# Patient Record
Sex: Female | Born: 1965 | Race: White | Hispanic: Yes | Smoking: Never smoker
Health system: Southern US, Community
[De-identification: ages and names within clinical notes are randomized; demographics above are authoritative.]

## PROBLEM LIST (undated history)

## (undated) DIAGNOSIS — M810 Age-related osteoporosis without current pathological fracture: Secondary | ICD-10-CM

## (undated) DIAGNOSIS — R7989 Other specified abnormal findings of blood chemistry: Secondary | ICD-10-CM

## (undated) DIAGNOSIS — F320 Major depressive disorder, single episode, mild: Secondary | ICD-10-CM

## (undated) DIAGNOSIS — E079 Disorder of thyroid, unspecified: Secondary | ICD-10-CM

## (undated) DIAGNOSIS — G4733 Obstructive sleep apnea (adult) (pediatric): Secondary | ICD-10-CM

## (undated) DIAGNOSIS — C801 Malignant (primary) neoplasm, unspecified: Secondary | ICD-10-CM

## (undated) DIAGNOSIS — R002 Palpitations: Secondary | ICD-10-CM

## (undated) DIAGNOSIS — Z9109 Other allergy status, other than to drugs and biological substances: Secondary | ICD-10-CM

## (undated) DIAGNOSIS — H9203 Otalgia, bilateral: Secondary | ICD-10-CM

## (undated) DIAGNOSIS — E785 Hyperlipidemia, unspecified: Secondary | ICD-10-CM

## (undated) DIAGNOSIS — C50919 Malignant neoplasm of unspecified site of unspecified female breast: Secondary | ICD-10-CM

## (undated) DIAGNOSIS — E669 Obesity, unspecified: Secondary | ICD-10-CM

## (undated) HISTORY — PX: APPENDECTOMY: SHX54

## (undated) HISTORY — DX: Malignant neoplasm of unspecified site of unspecified female breast: C50.919

## (undated) HISTORY — DX: Disorder of thyroid, unspecified: E07.9

## (undated) HISTORY — PX: MASTECTOMY: SHX3

## (undated) HISTORY — DX: Palpitations: R00.2

## (undated) HISTORY — DX: Obstructive sleep apnea (adult) (pediatric): G47.33

## (undated) HISTORY — PX: ABDOMINAL HYSTERECTOMY: SHX81

## (undated) HISTORY — DX: Age-related osteoporosis without current pathological fracture: M81.0

## (undated) HISTORY — DX: Obesity, unspecified: E66.9

## (undated) HISTORY — PX: ABDOMINOPLASTY: SUR9

## (undated) HISTORY — DX: Major depressive disorder, single episode, mild: F32.0

## (undated) HISTORY — DX: Other specified abnormal findings of blood chemistry: R79.89

## (undated) HISTORY — DX: Otalgia, bilateral: H92.03

## (undated) HISTORY — PX: BREAST RECONSTRUCTION: SHX9

## (undated) HISTORY — DX: Other allergy status, other than to drugs and biological substances: Z91.09

## (undated) HISTORY — DX: Hyperlipidemia, unspecified: E78.5

---

## 2011-05-21 DIAGNOSIS — N9489 Other specified conditions associated with female genital organs and menstrual cycle: Secondary | ICD-10-CM | POA: Insufficient documentation

## 2013-04-07 DIAGNOSIS — N952 Postmenopausal atrophic vaginitis: Secondary | ICD-10-CM | POA: Insufficient documentation

## 2013-11-20 LAB — HM COLONOSCOPY

## 2014-03-02 DIAGNOSIS — M222X2 Patellofemoral disorders, left knee: Secondary | ICD-10-CM

## 2014-03-02 DIAGNOSIS — M222X1 Patellofemoral disorders, right knee: Secondary | ICD-10-CM | POA: Insufficient documentation

## 2014-03-02 DIAGNOSIS — M7122 Synovial cyst of popliteal space [Baker], left knee: Secondary | ICD-10-CM | POA: Insufficient documentation

## 2014-04-27 DIAGNOSIS — G4733 Obstructive sleep apnea (adult) (pediatric): Secondary | ICD-10-CM | POA: Insufficient documentation

## 2014-11-11 DIAGNOSIS — N651 Disproportion of reconstructed breast: Secondary | ICD-10-CM | POA: Insufficient documentation

## 2015-04-05 DIAGNOSIS — Z853 Personal history of malignant neoplasm of breast: Secondary | ICD-10-CM | POA: Insufficient documentation

## 2016-03-07 DIAGNOSIS — Z923 Personal history of irradiation: Secondary | ICD-10-CM | POA: Insufficient documentation

## 2016-03-07 DIAGNOSIS — Z9221 Personal history of antineoplastic chemotherapy: Secondary | ICD-10-CM | POA: Insufficient documentation

## 2016-08-03 ENCOUNTER — Encounter (INDEPENDENT_AMBULATORY_CARE_PROVIDER_SITE_OTHER): Payer: Self-pay

## 2016-08-03 ENCOUNTER — Ambulatory Visit (INDEPENDENT_AMBULATORY_CARE_PROVIDER_SITE_OTHER): Payer: BC Managed Care – PPO | Admitting: Sports Medicine

## 2016-08-03 ENCOUNTER — Ambulatory Visit (INDEPENDENT_AMBULATORY_CARE_PROVIDER_SITE_OTHER): Payer: BC Managed Care – PPO

## 2016-08-03 DIAGNOSIS — M5412 Radiculopathy, cervical region: Secondary | ICD-10-CM

## 2016-08-03 DIAGNOSIS — M7918 Myalgia, other site: Secondary | ICD-10-CM | POA: Insufficient documentation

## 2016-08-03 MED ORDER — CYCLOBENZAPRINE HCL 10 MG PO TABS: 10.0000 mg | ORAL_TABLET | Freq: Every day | ORAL | 11 refills | Status: DC

## 2016-08-03 MED ORDER — PREDNISONE 50 MG PO TABS: ORAL_TABLET | ORAL | 0 refills | Status: DC

## 2016-08-03 NOTE — Assessment & Plan Note (Signed)
Right periscapular radiculopathy, C5, C6. She does have history of metastatic breast cancer so we are going to obtain an x-ray, early MRI, 5 days of prednisone, I'm going to refill her Flexeril and she is going to aggressive formal physical therapy. Return to see me in one month.

## 2016-08-03 NOTE — Progress Notes (Signed)
   Subjective:    I'm seeing this patient as a consultation for:  Dr. Katrinka Blazing.  CC: Right shoulder pain  HPI: This is a pleasant 51 year old female, she comes in with a several year history of pain between her shoulder blades, right-sided, wrapping around the shoulder blades to the latissimus. Moderate, persistent without radiation down the arm, no hand or fingertip paresthesias. She does have a history of stage III breast cancer, post breast reconstruction, she did have sentinel node biopsy and suspected surgical cure. No trauma, no constitutional symptoms, she would like to establish medical care with my partner Sherlie Ban PA-C.  Past medical history:  Negative.  See flowsheet/record as well for more information.  Surgical history: Negative.  See flowsheet/record as well for more information.  Family history: Negative.  See flowsheet/record as well for more information.  Social history: Negative.  See flowsheet/record as well for more information.  Allergies, and medications have been entered into the medical record, reviewed, and no changes needed.   Review of Systems: No headache, visual changes, nausea, vomiting, diarrhea, constipation, dizziness, abdominal pain, skin rash, fevers, chills, night sweats, weight loss, swollen lymph nodes, body aches, joint swelling, muscle aches, chest pain, shortness of breath, mood changes, visual or auditory hallucinations.   Objective:   General: Well Developed, well nourished, and in no acute distress.  Neuro/Psych: Alert and oriented x3, extra-ocular muscles intact, able to move all 4 extremities, sensation grossly intact. Skin: Warm and dry, no rashes noted.  Respiratory: Not using accessory muscles, speaking in full sentences, trachea midline.  Cardiovascular: Pulses palpable, no extremity edema. Abdomen: Does not appear distended. Neck: Negative spurling's Full neck range of motion Grip strength and sensation normal in bilateral  hands Strength good C4 to T1 distribution No sensory change to C4 to T1 Reflexes normal  Impression and Recommendations:   This case required medical decision making of moderate complexity.  Right cervical radiculopathy Right periscapular radiculopathy, C5, C6. She does have history of metastatic breast cancer so we are going to obtain an x-ray, early MRI, 5 days of prednisone, I'm going to refill her Flexeril and she is going to aggressive formal physical therapy. Return to see me in one month.

## 2016-08-10 ENCOUNTER — Telehealth: Payer: Self-pay | Admitting: Sports Medicine

## 2016-08-10 MED ORDER — DIAZEPAM 5 MG PO TABS: ORAL_TABLET | ORAL | 0 refills | Status: DC

## 2016-08-10 NOTE — Telephone Encounter (Signed)
Rx faxed. Left information on Pt's VM.

## 2016-08-10 NOTE — Telephone Encounter (Signed)
Prescription for Valium is in my box. 

## 2016-08-10 NOTE — Telephone Encounter (Signed)
Pt would like to be premedicated prior to upcoming MRI on Monday. Routing for review.

## 2016-08-13 ENCOUNTER — Encounter: Payer: Self-pay | Admitting: Emergency Medicine

## 2016-08-13 ENCOUNTER — Emergency Department (INDEPENDENT_AMBULATORY_CARE_PROVIDER_SITE_OTHER)
Admission: EM | Admit: 2016-08-13 | Discharge: 2016-08-13 | Disposition: A | Discharge status: Home / Self Care | Payer: BC Managed Care – PPO | Source: Home / Self Care | Attending: Family Medicine | Admitting: Family Medicine

## 2016-08-13 ENCOUNTER — Ambulatory Visit (INDEPENDENT_AMBULATORY_CARE_PROVIDER_SITE_OTHER): Payer: BC Managed Care – PPO

## 2016-08-13 ENCOUNTER — Emergency Department (INDEPENDENT_AMBULATORY_CARE_PROVIDER_SITE_OTHER): Payer: BC Managed Care – PPO

## 2016-08-13 ENCOUNTER — Other Ambulatory Visit: Payer: BC Managed Care – PPO

## 2016-08-13 DIAGNOSIS — M79621 Pain in right upper arm: Secondary | ICD-10-CM | POA: Diagnosis not present

## 2016-08-13 DIAGNOSIS — R0789 Other chest pain: Secondary | ICD-10-CM | POA: Diagnosis not present

## 2016-08-13 DIAGNOSIS — M5412 Radiculopathy, cervical region: Secondary | ICD-10-CM

## 2016-08-13 DIAGNOSIS — R0781 Pleurodynia: Secondary | ICD-10-CM

## 2016-08-13 DIAGNOSIS — R079 Chest pain, unspecified: Secondary | ICD-10-CM

## 2016-08-13 HISTORY — DX: Malignant (primary) neoplasm, unspecified: C80.1

## 2016-08-13 MED ORDER — GABAPENTIN 100 MG PO CAPS: 100.0000 mg | ORAL_CAPSULE | Freq: Three times a day (TID) | ORAL | 0 refills | Status: DC

## 2016-08-13 NOTE — ED Provider Notes (Signed)
CSN: [id number]     Arrival date & time 08/13/16  1709 History   First MD Initiated Contact with Patient 08/13/16 1728     Chief Complaint  Patient presents with  . Breast Pain    right axilla and border of breast   (Consider location/radiation/quality/duration/timing/severity/associated sxs/prior Treatment) HPI Sherry Romero is a 51 y.o. female presenting to UC with c/o Right axillary and Right sided chest wall pain since last visit with Dr. Dianah Field on 08/03/16.  Pt was seen at that time for cervical radiculopathy. Pt notes she was also treated recently for shingles on 07/24/16.  Pain is aching and burning at times but no rash.  Pt is a breast cancer survivor and has breast implants about 2 years ago after a total mastectomy.  Pt is concerned about lymph nodes in her chest and axilla causing the pain.  She does have routine f/u with her oncologist in March.  Denies fever, chills, cough, congestion, SOB.    Past Medical History:  Diagnosis Date  . Cancer [facility])    Past Surgical History:  Procedure Laterality Date  . implant breast Left   . MASTECTOMY Bilateral    No family history on file. Social History  Substance Use Topics  . Smoking status: Never Smoker  . Smokeless tobacco: Never Used  . Alcohol use No   OB History    No data available     Review of Systems  Constitutional: Negative for chills, diaphoresis, fatigue, fever and unexpected weight change.  Cardiovascular: Positive for chest pain (Right side). Negative for palpitations and leg swelling.  Gastrointestinal: Negative for diarrhea, nausea and vomiting.  Musculoskeletal: Negative for arthralgias, gait problem and joint swelling.  Skin: Negative for color change, rash and wound.    Allergies  Patient has no known allergies.  Home Medications   Prior to Admission medications   Medication Sig Start Date End Date Taking? Authorizing Provider  levothyroxine (SYNTHROID, LEVOTHROID) 100 MCG tablet Take  100 mcg by mouth daily before breakfast. Dose unknown   Yes Historical Provider, MD  cyclobenzaprine (FLEXERIL) 10 MG tablet Take 1 tablet (10 mg total) by mouth at bedtime. 08/03/16   Silverio Decamp, MD  diazepam (VALIUM) 5 MG tablet Take 1 tab PO 1 hour before procedure or imaging. 08/10/16   Silverio Decamp, MD  gabapentin (NEURONTIN) 100 MG capsule Take 1 capsule (100 mg total) by mouth 3 (three) times daily. 08/13/16   Noland Fordyce, PA-C  predniSONE (DELTASONE) 50 MG tablet One tab PO daily for 5 days. 08/03/16   Silverio Decamp, MD   Meds Ordered and Administered this Visit  Medications - No data to display  BP 103/69 (BP Location: Right Arm)   Pulse 85   Temp 98.5 F (36.9 C) (Oral)   Resp 16   Ht 4\' 11"  (1.499 m)   Wt 185 lb (83.9 kg)   SpO2 96%   BMI 37.37 kg/m  No data found.   Physical Exam  Constitutional: She is oriented to person, place, and time. She appears well-developed and well-nourished. No distress.  HENT:  Head: Normocephalic and atraumatic.  Eyes: EOM are normal.  Neck: Normal range of motion.  Cardiovascular: Normal rate and regular rhythm.   Pulmonary/Chest: Effort normal and breath sounds normal. No respiratory distress. She has no wheezes. She has no rales. She exhibits tenderness.    Mild tenderness to Right axilla and Right side anterior chest. No erythema or rashes. No crepitus.  Musculoskeletal: Normal  range of motion.  Neurological: She is alert and oriented to person, place, and time.  Skin: Skin is warm and dry. She is not diaphoretic.  Psychiatric: She has a normal mood and affect. Her behavior is normal.  Nursing note and vitals reviewed.   Urgent Care Course     Procedures (including critical care time)  Labs Review Labs Reviewed - No data to display  Imaging Review Dg Chest 2 View  Result Date: 08/13/2016 CLINICAL DATA:  51 y/o  F; EXAM: CHEST  2 VIEW COMPARISON:  None. FINDINGS: The heart size and mediastinal  contours are within normal limits. Both lungs are clear. No acute osseous abnormality. Left axillary surgical clips. Mild S-shaped curvature of the spine and degenerative changes. IMPRESSION: No active cardiopulmonary disease. Electronically Signed   By: Kristine Garbe M.D.   On: 08/13/2016 18:05     MDM   1. Axillary pain, right   2. Right-sided chest pain    Right axillary pain and Right side chest pain. Pt reports recent shingles in similar area. Rash no longer present.  Pain could be due to postherpetic neuralgia.  Rx: gabapentin  Encouraged f/u with PCP and oncologist for further evaluation and recheck of symptoms if not improving in 1 week, sooner if worsening.    Noland Fordyce, PA-C 08/13/16 1926

## 2016-08-13 NOTE — ED Triage Notes (Signed)
Error: pain in right axilla.

## 2016-08-13 NOTE — ED Triage Notes (Signed)
Patient says she has had pain in left axilla and along border of left breast where she has implant post mastectomy in past; states unrelieved with ibuprofen and  Has been going on since last visit 08/03/16.

## 2016-08-14 ENCOUNTER — Telehealth: Payer: Self-pay | Admitting: Sports Medicine

## 2016-08-14 NOTE — Telephone Encounter (Signed)
Diazepam is not for pain and is more dangerous and addictive than gabapentin.  She needs to take the gabapentin.

## 2016-08-14 NOTE — Telephone Encounter (Signed)
Patient called request to know if she can have Diazepam called in for her pain. She said she was called in gabapentin but she is afraid to take that and she took the diazepam before she had the mri and it took away her pain and it helped her a lot. Please adv- Thanks

## 2016-08-15 NOTE — Telephone Encounter (Signed)
Okay I will call patient back to advise her of this. Thank you

## 2016-08-22 ENCOUNTER — Encounter: Payer: Self-pay | Admitting: Physician Assistant

## 2016-08-22 ENCOUNTER — Ambulatory Visit (INDEPENDENT_AMBULATORY_CARE_PROVIDER_SITE_OTHER): Payer: BC Managed Care – PPO | Admitting: Physician Assistant

## 2016-08-22 VITALS — BP 125/65 | HR 85 | Wt 183.0 lb

## 2016-08-22 DIAGNOSIS — E039 Hypothyroidism, unspecified: Secondary | ICD-10-CM | POA: Insufficient documentation

## 2016-08-22 DIAGNOSIS — R7989 Other specified abnormal findings of blood chemistry: Secondary | ICD-10-CM | POA: Diagnosis not present

## 2016-08-22 DIAGNOSIS — M810 Age-related osteoporosis without current pathological fracture: Secondary | ICD-10-CM | POA: Insufficient documentation

## 2016-08-22 HISTORY — DX: Other specified abnormal findings of blood chemistry: R79.89

## 2016-08-22 LAB — POCT UA - MICROALBUMIN
CREATININE, POC: 50 mg/dL
MICROALBUMIN (UR) POC: 10 mg/L

## 2016-08-22 NOTE — Progress Notes (Signed)
HPI:                                                                Sherry Romero is a 51 y.o. female who presents to [place]: [facility] today for elevated creatinine  Patient with history of LCIS (2008) was seen by her Oncologist for follow-up on 08/20/16 and found to have an elevated serum creatinine of 1.11. Baseline creatinine 0.70 - 0.80 (12/22/15, 10/02/15)  Patient reports on Friday she took Dulcolax for constipation. Patient states on Saturday and Sunday she had abdominal cramping, vomiting and diarrhea. These symptoms have since resolved. She denies fevers, chills, weight loss. Denies dysuria, frequency, urgency, and hematuria.   Past Medical History:  Diagnosis Date  . Cancer ([place])   . Hyperlipidemia   . Obesity   . OSA (obstructive sleep apnea)   . Osteoporosis   . Thyroid disease    Past Surgical History:  Procedure Laterality Date  . implant breast Left   . MASTECTOMY Bilateral    Social History  Substance Use Topics  . Smoking status: Never Smoker  . Smokeless tobacco: Never Used  . Alcohol use No   family history is not on file.  ROS: negative except as noted in the HPI  Medications: Current Outpatient Prescriptions  Medication Sig Dispense Refill  . atorvastatin (LIPITOR) 20 MG tablet Take 20 mg by mouth daily.    . Biotin 800 MCG TABS Take 800 mcg by mouth.    . co-enzyme Q-10 30 MG capsule Take 100 mg by mouth daily.    . cyclobenzaprine (FLEXERIL) 10 MG tablet Take 1 tablet (10 mg total) by mouth at bedtime. 30 tablet 11  . denosumab (PROLIA) 60 MG/ML SOLN injection Inject 60 mg into the skin every 6 (six) months. Administer in upper arm, thigh, or abdomen    . levothyroxine (SYNTHROID, LEVOTHROID) 100 MCG tablet Take 50 mcg by mouth daily before breakfast. Dose unknown     . magnesium oxide (MAG-OX) 400 MG tablet Take 400 mg by mouth daily.    . Naltrexone-Bupropion HCl ER 8-90 MG TB12 Take 8-90 mg by mouth  daily.     No current facility-administered medications for this visit.    Allergies  Allergen Reactions  . Pollen Extract Shortness Of Breath    [Other] asthma  . Bee Pollen Other (See Comments)    Allergic to bees  -Shock] anaphylaxis  . Latex Rash    [Rash] red rash (gloves, and tape products)  . Letrozole Other (See Comments)    Muscle spasms allergic to generic only  . Levothyroxine Nausea Only    Only occurs with generic, not name brand  . Pork Allergy Nausea Only    GI upset  . Tape Rash    [Rash] [Other] red/swollen       Objective:  BP 125/65   Pulse 85   Wt 183 lb (83 kg)   BMI 36.96 kg/m  Gen: well-groomed, obese, cooperative, not ill-appearing, no distress Pulm: Normal work of breathing, normal phonation Cv: no peripheral edema Neuro: alert and oriented x 3, EOM's intact MSK: normal gait and station Skin: warm and dry, no rashes or lesions on exposed skin, no cyanosis   Results for orders placed or  performed in visit on 08/22/16 (from the past 72 hour(s))  POCT UA - Microalbumin     Status: Normal   Collection Time: 08/22/16 10:37 AM  Result Value Ref Range   Microalbumin Ur, POC 10 mg/L   Creatinine, POC 50 mg/dL   Albumin/Creatinine Ratio, Urine, POC <30    No results found.  I personally reviewed outside labs from 08/20/2016 including CMP which showed creatinine of 1.11, electrolytes and transaminases wnl, CBC wnl, elevated triglycerides (160), and TSH wnl  Assessment and Plan: 51 y.o. female with   1. Elevated serum creatinine - like pre-renal AKI secondary to dehydration from vomiting and diarrhea - POCT UA - Microalbumin wnl today - reviewed patient's medication list, including OTC supplements - plan to avoid nephrotoxic drugs, including NSAIDs and laxatives, increase hydration status and recheck Scr and GFR in 1 week - BASIC METABOLIC PANEL WITH GFR  Patient education and anticipatory guidance given Patient agrees with treatment  plan Follow-up in 1 week for labs or sooner as needed  Darlyne Russian PA-C

## 2016-08-22 NOTE — Patient Instructions (Addendum)
Go get your blood drawn on Monday, March 5th. The lab is a walk-in open M-F 8a-5p (closed 12:30-1:30p). You do not need to be fasting  No over-the-counter pain relievers (no Aspirin, Fenoprofen, ibuprofen, motrin, advil, aleve, naproxen, etc.)  No laxatives  Stay well-hydrated - drink at least 1L of fluid per day

## 2016-08-29 LAB — BASIC METABOLIC PANEL WITH GFR
BUN: 19 mg/dL (ref 7–25)
CALCIUM: 9.9 mg/dL (ref 8.6–10.4)
CHLORIDE: 101 mmol/L (ref 98–110)
CO2: 25 mmol/L (ref 20–31)
Creat: 0.74 mg/dL (ref 0.50–1.05)
GFR, Est African American: >89 mL/min (ref >=60)
GLUCOSE: 91 mg/dL (ref 65–99)
Potassium: 4.2 mmol/L (ref 3.5–5.3)
Sodium: 137 mmol/L (ref 135–146)

## 2016-08-29 NOTE — Progress Notes (Signed)
Patient's BMP today shows resolution of AKI and normal Scr and BUN  Lab Results  Component Value Date   CREATININE 0.74 08/28/2016   BUN 19 08/28/2016   NA 137 08/28/2016   K 4.2 08/28/2016   CL 101 08/28/2016   CO2 25 08/28/2016

## 2016-08-31 ENCOUNTER — Other Ambulatory Visit: Payer: Self-pay | Admitting: Physician Assistant

## 2016-08-31 ENCOUNTER — Ambulatory Visit: Payer: BC Managed Care – PPO | Admitting: Sports Medicine

## 2016-09-03 ENCOUNTER — Ambulatory Visit: Payer: BC Managed Care – PPO | Admitting: Sports Medicine

## 2016-09-20 ENCOUNTER — Ambulatory Visit: Payer: BC Managed Care – PPO | Admitting: Sports Medicine

## 2016-09-24 ENCOUNTER — Encounter: Payer: Self-pay | Admitting: Physician Assistant

## 2016-09-24 ENCOUNTER — Ambulatory Visit (INDEPENDENT_AMBULATORY_CARE_PROVIDER_SITE_OTHER): Payer: BC Managed Care – PPO | Admitting: Physician Assistant

## 2016-09-24 ENCOUNTER — Encounter: Payer: Self-pay | Admitting: Sports Medicine

## 2016-09-24 ENCOUNTER — Telehealth: Payer: Self-pay

## 2016-09-24 ENCOUNTER — Ambulatory Visit (INDEPENDENT_AMBULATORY_CARE_PROVIDER_SITE_OTHER): Payer: BC Managed Care – PPO | Admitting: Sports Medicine

## 2016-09-24 VITALS — BP 114/74 | HR 71 | Wt 180.0 lb

## 2016-09-24 DIAGNOSIS — E781 Pure hyperglyceridemia: Secondary | ICD-10-CM | POA: Diagnosis not present

## 2016-09-24 DIAGNOSIS — E010 Iodine-deficiency related diffuse (endemic) goiter: Secondary | ICD-10-CM | POA: Diagnosis not present

## 2016-09-24 DIAGNOSIS — Z6836 Body mass index (BMI) 36.0-36.9, adult: Secondary | ICD-10-CM | POA: Diagnosis not present

## 2016-09-24 DIAGNOSIS — M47812 Spondylosis without myelopathy or radiculopathy, cervical region: Secondary | ICD-10-CM | POA: Diagnosis not present

## 2016-09-24 DIAGNOSIS — M8580 Other specified disorders of bone density and structure, unspecified site: Secondary | ICD-10-CM

## 2016-09-24 DIAGNOSIS — E6609 Other obesity due to excess calories: Secondary | ICD-10-CM | POA: Diagnosis not present

## 2016-09-24 DIAGNOSIS — R768 Other specified abnormal immunological findings in serum: Secondary | ICD-10-CM

## 2016-09-24 DIAGNOSIS — IMO0001 Reserved for inherently not codable concepts without codable children: Secondary | ICD-10-CM

## 2016-09-24 DIAGNOSIS — R760 Raised antibody titer: Secondary | ICD-10-CM

## 2016-09-24 LAB — LIPID PANEL W/REFLEX DIRECT LDL
CHOL/HDL RATIO: 3.1 ratio (ref <5.0)
CHOLESTEROL: 135 mg/dL (ref <200)
HDL: 44 mg/dL — AB (ref >50)
LDL-Cholesterol: 74 mg/dL
Non-HDL Cholesterol (Calc): 91 mg/dL (ref <130)
Triglycerides: 89 mg/dL (ref <150)

## 2016-09-24 MED ORDER — NALTREXONE-BUPROPION HCL ER 8-90 MG PO TB12: ORAL_TABLET | ORAL | 0 refills | Status: DC

## 2016-09-24 MED ORDER — MENTHOL (TOPICAL ANALGESIC) 4 % EX GEL: CUTANEOUS | Status: DC

## 2016-09-24 NOTE — Telephone Encounter (Signed)
-----   Message from [facility], [place] sent at 09/24/2016  1:11 PM EDT ----- Let Sherry Romero know I am going to order an ultrasound of her thyroid. Her labs have always been monitored and look good, but we have never gotten any imaging of the thyroid, so I think it would be a good idea .

## 2016-09-24 NOTE — Assessment & Plan Note (Signed)
80-90% improvement with conservative measures, continue with Flexeril, I have advised that she use Biofreeze and gets massages for the remaining muscular pain. Cervical spine MRI showed a small protruding disc but otherwise negative.

## 2016-09-24 NOTE — Progress Notes (Signed)
  Subjective:    CC: Follow-up  HPI: Neck pain: Resolved with rehabilitation exercises, prednisone, and occasional Flexeril. Only has slight pain now.  Past medical history:  Negative.  See flowsheet/record as well for more information.  Surgical history: Negative.  See flowsheet/record as well for more information.  Family history: Negative.  See flowsheet/record as well for more information.  Social history: Negative.  See flowsheet/record as well for more information.  Allergies, and medications have been entered into the medical record, reviewed, and no changes needed.   Review of Systems: No fevers, chills, night sweats, weight loss, chest pain, or shortness of breath.   Objective:    General: Well Developed, well nourished, and in no acute distress.  Neuro: Alert and oriented x3, extra-ocular muscles intact, sensation grossly intact.  HEENT: Normocephalic, atraumatic, pupils equal round reactive to light, neck supple, no masses, no lymphadenopathy, thyroid nonpalpable.  Skin: Warm and dry, no rashes. Cardiac: Regular rate and rhythm, no murmurs rubs or gallops, no lower extremity edema.  Respiratory: Clear to auscultation bilaterally. Not using accessory muscles, speaking in full sentences. Neck: Negative spurling's Full neck range of motion Grip strength and sensation normal in bilateral hands Strength good C4 to T1 distribution No sensory change to C4 to T1 Reflexes normal  Impression and Recommendations:    Cervical spondylosis 80-90% improvement with conservative measures, continue with Flexeril, I have advised that she use Biofreeze and gets massages for the remaining muscular pain. Cervical spine MRI showed a small protruding disc but otherwise negative.

## 2016-09-24 NOTE — Patient Instructions (Addendum)
Protein powder Vega-One - vanilla flavor (white and blue package)  For your weight: - follow-up with the nutritionist - you will be contacted to schedule an appointment - restart Contrave starter pack - follow-up in 1 month for weight check    Food Choices to Lower Your Triglycerides Triglycerides are a type of fat in your blood. High levels of triglycerides can increase the risk of heart disease and stroke. If your triglyceride levels are high, the foods you eat and your eating habits are very important. Choosing the right foods can help lower your triglycerides. What general guidelines do I need to follow?  Lose weight if you are overweight.  Limit or avoid alcohol.  Fill one half of your plate with vegetables and green salads.  Limit fruit to two servings a day. Choose fruit instead of juice.  Make one fourth of your plate whole grains. Look for the word "whole" as the first word in the ingredient list.  Fill one fourth of your plate with lean protein foods.  Enjoy fatty fish (such as salmon, mackerel, sardines, and tuna) three times a week.  Choose healthy fats.  Limit foods high in starch and sugar.  Eat more home-cooked food and less restaurant, buffet, and fast food.  Limit fried foods.  Cook foods using methods other than frying.  Limit saturated fats.  Check ingredient lists to avoid foods with partially hydrogenated oils (trans fats) in them. What foods can I eat? Grains  Whole grains, such as whole wheat or whole grain breads, crackers, cereals, and pasta. Unsweetened oatmeal, bulgur, barley, quinoa, or brown rice. Corn or whole wheat flour tortillas. Vegetables  Fresh or frozen vegetables (raw, steamed, roasted, or grilled). Green salads. Fruits  All fresh, canned (in natural juice), or frozen fruits. Meat and Other Protein Products  Ground beef (85% or leaner), grass-fed beef, or beef trimmed of fat. Skinless chicken or [place]. Ground chicken or [place].  Pork trimmed of fat. All fish and seafood. Eggs. Dried beans, peas, or lentils. Unsalted nuts or seeds. Unsalted canned or dry beans. Dairy  Low-fat dairy products, such as skim or 1% milk, 2% or reduced-fat cheeses, low-fat ricotta or cottage cheese, or plain low-fat yogurt. Fats and Oils  Tub margarines without trans fats. Light or reduced-fat mayonnaise and salad dressings. Avocado. Safflower, olive, or canola oils. Natural peanut or almond butter. The items listed above may not be a complete list of recommended foods or beverages. Contact your dietitian for more options.  What foods are not recommended? Grains  White bread. White pasta. White rice. Cornbread. Bagels, pastries, and croissants. Crackers that contain trans fat. Vegetables  White potatoes. Corn. Creamed or fried vegetables. Vegetables in a cheese sauce. Fruits  Dried fruits. Canned fruit in light or heavy syrup. Fruit juice. Meat and Other Protein Products  Fatty cuts of meat. Ribs, chicken wings, bacon, sausage, bologna, salami, chitterlings, fatback, hot dogs, bratwurst, and packaged luncheon meats. Dairy  Whole or 2% milk, cream, half-and-half, and cream cheese. Whole-fat or sweetened yogurt. Full-fat cheeses. Nondairy creamers and whipped toppings. Processed cheese, cheese spreads, or cheese curds. Sweets and Desserts  Corn syrup, sugars, honey, and molasses. Candy. Jam and jelly. Syrup. Sweetened cereals. Cookies, pies, cakes, donuts, muffins, and ice cream. Fats and Oils  Butter, stick margarine, lard, shortening, ghee, or bacon fat. Coconut, palm kernel, or palm oils. Beverages  Alcohol. Sweetened drinks (such as sodas, lemonade, and fruit drinks or punches). The items listed above may not be a complete  list of foods and beverages to avoid. Contact your dietitian for more information.  This information is not intended to replace advice given to you by your health care provider. Make sure you discuss any questions you  have with your health care provider. Document Released: 03/29/2004 Document Revised: 11/17/2015 Document Reviewed: 04/15/2013 Elsevier Interactive Patient Education  2017 [employer].

## 2016-09-24 NOTE — Progress Notes (Signed)
HPI:                                                                Sherry Romero is a 51 y.o. female who presents to [place]: [place] today to establish care   Current concerns include weight  Acquired Hypothyroidism: taking Synthroid 50 mcg daily. Last TSH 1.7 05/2017.  HLD: taking Lipitor 20mg  daily. Denies myalgias. Lipid panel 06/19/2016 TC 167 TG 160 HDL 47 LDL 88  Osteoporosis: doing Prolia injections with [facility] orthopedics  Obesity: patient has been prescribed Contrave by another provider. She states she took it for a couple of days, but she was scared about side effects and self-discontinued. She has also been on Qsymia in the past. She is currently tracking her meals with an app. She does not exercise.  Hx of breast cancer: s/p bilateral mastectomy. Completed 10 years of hormone blocking agents. She is followed by Dr. Oswald Hillock, Oncology at [facility].   Patient is also wondering if she needs to be vaccinated for chickenpox because she never had the virus as a child.  Health Maintenance Health Maintenance  Topic Date Due  . Hepatitis C Screening  1965/07/15  . HIV Screening  08/04/1978  . INFLUENZA VACCINE  01/23/2017  . COLONOSCOPY  11/21/2023  . TETANUS/TDAP  10/03/2025    GYN/Sexual Health  Menstrual status: hysterectomy for DUB  History of abnormal pap smears: no  Sexually active: not currently 2/2 to pain  Current contraception: none  Health Habits  Diet: fair  Exercise: none  Past Medical History:  Diagnosis Date  . Breast cancer ([place])   . Cancer ([place])   . Hyperlipidemia   . Obesity   . OSA (obstructive sleep apnea)   . Osteoporosis   . Thyroid disease    Past Surgical History:  Procedure Laterality Date  . ABDOMINAL HYSTERECTOMY     TAH - no cancer  . ABDOMINOPLASTY    . APPENDECTOMY    . BREAST RECONSTRUCTION    . CESAREAN SECTION     x 3  . MASTECTOMY Bilateral    Social History   Substance Use Topics  . Smoking status: Never Smoker  . Smokeless tobacco: Never Used  . Alcohol use 1.2 oz/week    2 Glasses of wine per week   family history includes Heart disease in her maternal grandfather and paternal grandfather; Hyperlipidemia in her father and mother; Hypertension in her father, maternal grandfather, maternal grandmother, mother, paternal grandfather, and paternal grandmother; Stroke in her mother.  ROS: negative except as noted in the HPI  Medications: Current Outpatient Prescriptions  Medication Sig Dispense Refill  . atorvastatin (LIPITOR) 20 MG tablet TAKE 1 TABLET(20 MG) BY MOUTH DAILY 90 tablet 0  . Biotin 800 MCG TABS Take 800 mcg by mouth.    . co-enzyme Q-10 30 MG capsule Take 100 mg by mouth daily.    . cyclobenzaprine (FLEXERIL) 10 MG tablet Take 1 tablet (10 mg total) by mouth at bedtime. 30 tablet 11  . denosumab (PROLIA) 60 MG/ML SOLN injection Inject 60 mg into the skin every 6 (six) months. Administer in upper arm, thigh, or abdomen    . levothyroxine (SYNTHROID, LEVOTHROID) 50 MCG tablet TAKE 1 TABLET BY MOUTH EVERY DAY  30 tablet 0  . magnesium oxide (MAG-OX) 400 MG tablet Take 400 mg by mouth daily.    . Naltrexone-Bupropion HCl ER 8-90 MG TB12 1 tab daily for week 1, then 1 tab BID for week 2, then 2 tab PO qAM and 1 tab PO qPM for week 3, then 2 tabs BID. 80 tablet 0   No current facility-administered medications for this visit.    Allergies  Allergen Reactions  . Pollen Extract Shortness Of Breath    [Other] asthma  . Bee Pollen Other (See Comments)    Allergic to bees  -Shock] anaphylaxis  . Latex Rash    [Rash] red rash (gloves, and tape products)  . Letrozole Other (See Comments)    Muscle spasms allergic to generic only  . Levothyroxine Nausea Only    Only occurs with generic, not name brand  . Pork Allergy Nausea Only    GI upset  . Tape Rash    [Rash] [Other] red/swollen       Objective:  BP 114/74   Pulse 71   Wt  180 lb (81.6 kg)   BMI 36.36 kg/m  Gen: well-groomed, cooperative, obese, not ill-appearing, no distress HEENT: normal conjunctiva, wearing glasses, TM's clear, oropharynx clear, moist mucus membranes, there is thyromegaly, right greater than left, thyroid exhibits no tenderness Pulm: Normal work of breathing, normal phonation, clear to auscultation bilaterally CV: Normal rate, regular rhythm, s1 and s2 distinct, no murmurs, clicks or rubs, no carotid bruit GI: abdomen soft, nondistended, nontender, no masses Neuro: alert and oriented x 3, EOM's intact, PERRLA, DTR's intact, normal tone, no tremor MSK: moving all extremities, normal gait and station, no peripheral edema Skin: warm and dry, no rashes or lesions on exposed skin Psych: normal affect, euthymic mood, normal speech and thought content  Depression screen [facility] 2/9 09/24/2016  Decreased Interest 0  Down, Depressed, Hopeless 0  PHQ - 2 Score 0    Assessment and Plan: 51 y.o. female with   1. Hypertriglyceridemia - Lipid Panel w/reflex Direct LDL - low triglyceride diet  2. Class 2 obesity due to excess calories with serious comorbidity and body mass index (BMI) of 36.0 to 36.9 in adult - Amb ref to Medical Nutrition Therapy-MNT - discussed restarting Contrave. Patient was mainly concerned about seizures. She has no history of seizure disorders and was reassured she is low risk of this. - Naltrexone-Bupropion HCl ER 8-90 MG TB12; 1 tab daily for week 1, then 1 tab BID for week 2, then 2 tab PO qAM and 1 tab PO qPM for week 3, then 2 tabs BID.  Dispense: 80 tablet; Refill: 0 - follow-up weight check in 4 weeks  3. Osteopenia, unspecified location - VITAMIN D 25 Hydroxy (Vit-D Deficiency, Fractures) - cont to follow-up with Ortho for Prolia injections  4. Abnormal antibody titer - Varicella zoster antibody, IgG  5. Thyromegaly - US THYROID; Future   Patient education and anticipatory guidance given Patient agrees with  treatment plan Follow-up 4 weeks for weight check or sooner as needed  Darlyne Russian PA-C

## 2016-09-24 NOTE — Telephone Encounter (Signed)
Pt notified during visit with Dr. Darene Lamer -EH/RMA

## 2016-09-25 LAB — VITAMIN D 25 HYDROXY (VIT D DEFICIENCY, FRACTURES): VIT D 25 HYDROXY: 39 ng/mL (ref 30–100)

## 2016-09-25 LAB — VARICELLA ZOSTER ANTIBODY, IGG: Varicella IgG: >4000 Index — ABNORMAL HIGH (ref <135.00)

## 2016-09-27 ENCOUNTER — Other Ambulatory Visit: Payer: BC Managed Care – PPO

## 2016-09-30 ENCOUNTER — Other Ambulatory Visit: Payer: Self-pay | Admitting: Physician Assistant

## 2016-10-01 ENCOUNTER — Encounter: Payer: Self-pay | Admitting: Physician Assistant

## 2016-10-02 ENCOUNTER — Other Ambulatory Visit: Payer: BC Managed Care – PPO

## 2016-10-04 ENCOUNTER — Ambulatory Visit (INDEPENDENT_AMBULATORY_CARE_PROVIDER_SITE_OTHER): Payer: BC Managed Care – PPO

## 2016-10-04 DIAGNOSIS — E049 Nontoxic goiter, unspecified: Secondary | ICD-10-CM | POA: Diagnosis not present

## 2016-10-04 DIAGNOSIS — E010 Iodine-deficiency related diffuse (endemic) goiter: Secondary | ICD-10-CM

## 2016-10-06 ENCOUNTER — Encounter: Payer: Self-pay | Admitting: Physician Assistant

## 2016-10-08 ENCOUNTER — Encounter: Payer: Self-pay | Admitting: Physician Assistant

## 2016-10-24 ENCOUNTER — Ambulatory Visit (INDEPENDENT_AMBULATORY_CARE_PROVIDER_SITE_OTHER): Payer: BC Managed Care – PPO | Admitting: Physician Assistant

## 2016-10-24 VITALS — BP 114/76 | HR 93 | Wt 178.0 lb

## 2016-10-24 DIAGNOSIS — IMO0001 Reserved for inherently not codable concepts without codable children: Secondary | ICD-10-CM

## 2016-10-24 DIAGNOSIS — E6609 Other obesity due to excess calories: Secondary | ICD-10-CM | POA: Diagnosis not present

## 2016-10-24 DIAGNOSIS — S99912A Unspecified injury of left ankle, initial encounter: Secondary | ICD-10-CM

## 2016-10-24 DIAGNOSIS — Z6836 Body mass index (BMI) 36.0-36.9, adult: Secondary | ICD-10-CM

## 2016-10-24 MED ORDER — NALTREXONE-BUPROPION HCL ER 8-90 MG PO TB12: 2.0000 | ORAL_TABLET | Freq: Two times a day (BID) | ORAL | 0 refills | Status: DC

## 2016-10-24 NOTE — Progress Notes (Signed)
HPI:                                                                Sherry Romero is a 51 y.o. female who presents to [place]: [place] today for weight check  Patient has been taking Contrave for the last month without difficulty. She also saw the medical nutritionist and has been working on her diet. She has lost 4 pounds and reports she is feeling "great."  Patient also reports she inverted her left ankle while walking approx. 4 days ago. She states she initially had moderate pain, but this has since resolved. She would like to have it looked at today.  Past Medical History:  Diagnosis Date  . Breast cancer ([place])   . Cancer ([place])   . Hyperlipidemia   . Obesity   . OSA (obstructive sleep apnea)   . Osteoporosis   . Thyroid disease    Past Surgical History:  Procedure Laterality Date  . ABDOMINAL HYSTERECTOMY     TAH - no cancer  . ABDOMINOPLASTY    . APPENDECTOMY    . BREAST RECONSTRUCTION    . CESAREAN SECTION     x 3  . MASTECTOMY Bilateral    Social History  Substance Use Topics  . Smoking status: Never Smoker  . Smokeless tobacco: Never Used  . Alcohol use 1.2 oz/week    2 Glasses of wine per week   family history includes Heart disease in her maternal grandfather and paternal grandfather; Hyperlipidemia in her father and mother; Hypertension in her father, maternal grandfather, maternal grandmother, mother, paternal grandfather, and paternal grandmother; Stroke in her mother.  ROS: negative except as noted in the HPI  Medications: Current Outpatient Prescriptions  Medication Sig Dispense Refill  . atorvastatin (LIPITOR) 20 MG tablet TAKE 1 TABLET(20 MG) BY MOUTH DAILY 90 tablet 0  . Biotin 800 MCG TABS Take 800 mcg by mouth.    . co-enzyme Q-10 30 MG capsule Take 100 mg by mouth daily.    . cyclobenzaprine (FLEXERIL) 10 MG tablet Take 1 tablet (10 mg total) by mouth at bedtime. 30 tablet 11  . denosumab  (PROLIA) 60 MG/ML SOLN injection Inject 60 mg into the skin every 6 (six) months. Administer in upper arm, thigh, or abdomen    . magnesium oxide (MAG-OX) 400 MG tablet Take 400 mg by mouth daily.    . Menthol, Topical Analgesic, (BIOFREEZE ROLL-ON COLORLESS) 4 % GEL Roll on topically to the affected area twice a day to 3 times a day    . Naltrexone-Bupropion HCl ER 8-90 MG TB12 1 tab daily for week 1, then 1 tab BID for week 2, then 2 tab PO qAM and 1 tab PO qPM for week 3, then 2 tabs BID. 80 tablet 0  . SYNTHROID 50 MCG tablet TAKE 1 TABLET BY MOUTH EVERY DAY 90 tablet 0   No current facility-administered medications for this visit.    Allergies  Allergen Reactions  . Pollen Extract Shortness Of Breath    [Other] asthma  . Bee Pollen Other (See Comments)    Allergic to bees  -Shock] anaphylaxis  . Latex Rash    [Rash] red rash (gloves, and tape products)  . Letrozole Other (  See Comments)    Muscle spasms allergic to generic only  . Levothyroxine Nausea Only    Only occurs with generic, not name brand  . Pork Allergy Nausea Only    GI upset  . Tape Rash    [Rash] [Other] red/swollen       Objective:  BP 114/76   Pulse 93   Wt 178 lb (80.7 kg)   BMI 35.95 kg/m  Gen: well-groomed, obese, cooperative, not ill-appearing, no distress Pulm: Normal work of breathing, normal phonation Neuro: alert and oriented x 3, normal tone, no tremor MSK: Left Ankle/Foot: atraumatic, no edema, no ecchymosis, no bony tenderness, full active ROM, well-healed longitudinal surgical scar on dorsum of left foot, strength intact; normal gait and station, no peripheral edema Psych: good eye contact, normal affect, euthymic mood, normal speech and thought content    No results found for this or any previous visit (from the past 72 hour(s)). No results found.    Assessment and Plan: 51 y.o. female with   1. Class 2 obesity due to excess calories with serious comorbidity and body mass index  (BMI) of 36.0 to 36.9 in adult - Naltrexone-Bupropion HCl ER 8-90 MG TB12; Take 2 tablets by mouth 2 (two) times daily.  Dispense: 80 tablet; Refill: 0  2. Left ankle injury - reassuring physical exam - I do not feel X-rays are warranted. Patient is pain-free with weight-bearing - reassurance provided  Patient education and anticipatory guidance given Patient agrees with treatment plan Follow-up as needed if symptoms worsen or fail to improve  Darlyne Russian PA-C

## 2016-11-21 ENCOUNTER — Ambulatory Visit: Payer: BC Managed Care – PPO

## 2016-11-23 ENCOUNTER — Encounter: Payer: Self-pay | Admitting: Physician Assistant

## 2016-11-25 ENCOUNTER — Other Ambulatory Visit: Payer: Self-pay | Admitting: Physician Assistant

## 2016-11-28 ENCOUNTER — Ambulatory Visit (INDEPENDENT_AMBULATORY_CARE_PROVIDER_SITE_OTHER): Payer: BC Managed Care – PPO | Admitting: Physician Assistant

## 2016-11-28 VITALS — BP 116/81 | HR 87 | Temp 98.2°F | Wt 181.0 lb

## 2016-11-28 DIAGNOSIS — L237 Allergic contact dermatitis due to plants, except food: Secondary | ICD-10-CM | POA: Insufficient documentation

## 2016-11-28 MED ORDER — TRIAMCINOLONE ACETONIDE 0.5 % EX OINT: 1.0000 "application " | TOPICAL_OINTMENT | Freq: Two times a day (BID) | CUTANEOUS | 0 refills | Status: DC

## 2016-11-28 MED ORDER — HYDROXYZINE HCL 50 MG PO TABS: 50.0000 mg | ORAL_TABLET | Freq: Every evening | ORAL | 0 refills | Status: DC | PRN

## 2016-11-28 NOTE — Progress Notes (Signed)
HPI:                                                                Sherry Romero is a 51 y.o. female who presents to [facility] today for rash  Onset: yesterday Location: started on right forearm Duration: constant Character: pruritic, burning Aggravating factors / Triggers: none Evolution: red papules Treatments tried: Benadryl, Calamine  Recent illness / systemic symptoms: none  Medication / drug exposure: none Recent travel: none Animal/insect exposure: recently gardening/landscaping History of allergies: yes Exposure to new soaps, perfumes, cleaning products: none Exposure to chemicals: none  Past Medical History:  Diagnosis Date  . Breast cancer ([place])   . Cancer ([place])   . Hyperlipidemia   . Obesity   . OSA (obstructive sleep apnea)   . Osteoporosis   . Thyroid disease    Past Surgical History:  Procedure Laterality Date  . ABDOMINAL HYSTERECTOMY     TAH - no cancer  . ABDOMINOPLASTY    . APPENDECTOMY    . BREAST RECONSTRUCTION    . CESAREAN SECTION     x 3  . MASTECTOMY Bilateral    Social History  Substance Use Topics  . Smoking status: Never Smoker  . Smokeless tobacco: Never Used  . Alcohol use 1.2 oz/week    2 Glasses of wine per week   family history includes Heart disease in her maternal grandfather and paternal grandfather; Hyperlipidemia in her father and mother; Hypertension in her father, maternal grandfather, maternal grandmother, mother, paternal grandfather, and paternal grandmother; Stroke in her mother.  ROS: negative except as noted in the HPI  Medications: Current Outpatient Prescriptions  Medication Sig Dispense Refill  . atorvastatin (LIPITOR) 20 MG tablet TAKE 1 TABLET BY MOUTH DAILY 90 tablet 0  . Biotin 800 MCG TABS Take 800 mcg by mouth.    . co-enzyme Q-10 30 MG capsule Take 100 mg by mouth daily.    . cyclobenzaprine (FLEXERIL) 10 MG tablet Take 1 tablet (10 mg total)  by mouth at bedtime. 30 tablet 11  . denosumab (PROLIA) 60 MG/ML SOLN injection Inject 60 mg into the skin every 6 (six) months. Administer in upper arm, thigh, or abdomen    . magnesium oxide (MAG-OX) 400 MG tablet Take 400 mg by mouth daily.    . Menthol, Topical Analgesic, (BIOFREEZE ROLL-ON COLORLESS) 4 % GEL Roll on topically to the affected area twice a day to 3 times a day    . Naltrexone-Bupropion HCl ER 8-90 MG TB12 Take 2 tablets by mouth 2 (two) times daily. 80 tablet 0  . SYNTHROID 50 MCG tablet TAKE 1 TABLET BY MOUTH EVERY DAY 90 tablet 0   No current facility-administered medications for this visit.    Allergies  Allergen Reactions  . Pollen Extract Shortness Of Breath    [Other] asthma  . Bee Pollen Other (See Comments)    Allergic to bees  -Shock] anaphylaxis  . Latex Rash    [Rash] red rash (gloves, and tape products)  . Letrozole Other (See Comments)    Muscle spasms allergic to generic only  . Levothyroxine Nausea Only    Only occurs with generic, not name brand  . Pork Allergy Nausea Only  GI upset  . Tape Rash    [Rash] [Other] red/swollen       Objective:  BP 116/81   Pulse 87   Temp 98.2 F (36.8 C) (Oral)   Wt 181 lb (82.1 kg)   BMI 36.56 kg/m  Gen: well-groomed, cooperative, not ill-appearing, no distress HEENT: normocephalic, atraumatic, no facial swelling, oropharynx clear, uvula midline  Pulm: Normal work of breathing, normal phonation, clear to auscultation bilaterally, no wheezes, rales or rhonchi CV: Normal rate, regular rhythm, s1 and s2 distinct, no murmurs, clicks or rubs  Neuro: alert and oriented x 3, EOM's intact, no tremor MSK: moving all extremities, normal gait and station, no peripheral edema Skin: warm, dry, intact; multiple erythematous papules on ventral aspect of right wrist/forearm    No results found for this or any previous visit (from the past 72 hour(s)). No results found.    Assessment and Plan: 51 y.o. female  with   1. Allergic contact dermatitis due to plants, except food - no angioedema or airway involvement. Rash is localized to right extremity - symptomatic management with topical corticosteroid and antihistamine at bedtime - triamcinolone ointment (KENALOG) 0.5 %; Apply 1 application topically 2 (two) times daily. To affected area, avoid eyes and face  Dispense: 30 g; Refill: 0 - hydrOXYzine (ATARAX/VISTARIL) 50 MG tablet; Take 1 tablet (50 mg total) by mouth at bedtime and may repeat dose one time if needed. For itching.  Dispense: 30 tablet; Refill: 0  Patient education and anticipatory guidance given Patient agrees with treatment plan Follow-up as needed if symptoms worsen or fail to improve  Darlyne Russian PA-C

## 2016-11-28 NOTE — Patient Instructions (Addendum)
- Apply steroid ointment to affected areas twice a day. AVOID CONTACT WITH FACE/EYES - Hydroxyzine 1 tab at bedtime as needed for itching - Wash all clothing and bedding to remove any oils/allergens - Remove all jewelry from wrists    Contact Dermatitis Dermatitis is redness, soreness, and swelling (inflammation) of the skin. Contact dermatitis is a reaction to certain substances that touch the skin. There are two types of contact dermatitis:  Irritant contact dermatitis. This type is caused by something that irritates your skin, such as dry hands from washing them too much. This type does not require previous exposure to the substance for a reaction to occur. This type is more common.  Allergic contact dermatitis. This type is caused by a substance that you are allergic to, such as a nickel allergy or poison ivy. This type only occurs if you have been exposed to the substance (allergen) before. Upon a repeat exposure, your body reacts to the substance. This type is less common.  What are the causes? Many different substances can cause contact dermatitis. Irritant contact dermatitis is most commonly caused by exposure to:  Makeup.  Soaps.  Detergents.  Bleaches.  Acids.  Metal salts, such as nickel.  Allergic contact dermatitis is most commonly caused by exposure to:  Poisonous plants.  Chemicals.  Jewelry.  Latex.  Medicines.  Preservatives in products, such as clothing.  What increases the risk? This condition is more likely to develop in:  People who have jobs that expose them to irritants or allergens.  People who have certain medical conditions, such as asthma or eczema.  What are the signs or symptoms? Symptoms of this condition may occur anywhere on your body where the irritant has touched you or is touched by you. Symptoms include:  Dryness or flaking.  Redness.  Cracks.  Itching.  Pain or a burning feeling.  Blisters.  Drainage of small  amounts of blood or clear fluid from skin cracks.  With allergic contact dermatitis, there may also be swelling in areas such as the eyelids, mouth, or genitals. How is this diagnosed? This condition is diagnosed with a medical history and physical exam. A patch skin test may be performed to help determine the cause. If the condition is related to your job, you may need to see an occupational medicine specialist. How is this treated? Treatment for this condition includes figuring out what caused the reaction and protecting your skin from further contact. Treatment may also include:  Steroid creams or ointments. Oral steroid medicines may be needed in more severe cases.  Antibiotics or antibacterial ointments, if a skin infection is present.  Antihistamine lotion or an antihistamine taken by mouth to ease itching.  A bandage (dressing).  Follow these instructions at home: [place] your skin as needed.  Apply cool compresses to the affected areas.  Try taking a bath with: ? Epsom salts. Follow the instructions on the packaging. You can get these at your local pharmacy or grocery store. ? Baking soda. Pour a small amount into the bath as directed by your health care provider. ? Colloidal oatmeal. Follow the instructions on the packaging. You can get this at your local pharmacy or grocery store.  Try applying baking soda paste to your skin. Stir water into baking soda until it reaches a paste-like consistency.  Do not scratch your skin.  Bathe less frequently, such as every other day.  Bathe in lukewarm water. Avoid using hot water. Medicines  Take or  apply over-the-counter and prescription medicines only as told by your health care provider.  If you were prescribed an antibiotic medicine, take or apply your antibiotic as told by your health care provider. Do not stop using the antibiotic even if your condition starts to improve. General instructions  Keep all  follow-up visits as told by your health care provider. This is important.  Avoid the substance that caused your reaction. If you do not know what caused it, keep a journal to try to track what caused it. Write down: ? What you eat. ? What cosmetic products you use. ? What you drink. ? What you wear in the affected area. This includes jewelry.  If you were given a dressing, take care of it as told by your health care provider. This includes when to change and remove it. Contact a health care provider if:  Your condition does not improve with treatment.  Your condition gets worse.  You have signs of infection such as swelling, tenderness, redness, soreness, or warmth in the affected area.  You have a fever.  You have new symptoms. Get help right away if:  You have a severe headache, neck pain, or neck stiffness.  You vomit.  You feel very sleepy.  You notice red streaks coming from the affected area.  Your bone or joint underneath the affected area becomes painful after the skin has healed.  The affected area turns darker.  You have difficulty breathing. This information is not intended to replace advice given to you by your health care provider. Make sure you discuss any questions you have with your health care provider. Document Released: 06/08/2000 Document Revised: 11/17/2015 Document Reviewed: 10/27/2014 Elsevier Interactive Patient Education  2018 [employer].

## 2016-11-29 ENCOUNTER — Encounter: Payer: Self-pay | Admitting: Physician Assistant

## 2016-11-30 ENCOUNTER — Telehealth: Payer: Self-pay

## 2016-11-30 DIAGNOSIS — L237 Allergic contact dermatitis due to plants, except food: Secondary | ICD-10-CM

## 2016-11-30 MED ORDER — PREDNISONE 10 MG (48) PO TBPK: ORAL_TABLET | Freq: Every day | ORAL | 0 refills | Status: DC

## 2016-11-30 NOTE — Telephone Encounter (Signed)
Pt reports the poison ivy has spread to her face.  Please advise. -EH/RMA

## 2016-11-30 NOTE — Telephone Encounter (Signed)
Prednisone taper sent to [facility] her to wash all of her bedding and clothing in hot water to remove the poison ivy oil

## 2016-12-17 ENCOUNTER — Ambulatory Visit (INDEPENDENT_AMBULATORY_CARE_PROVIDER_SITE_OTHER): Payer: BC Managed Care – PPO | Admitting: Physician Assistant

## 2016-12-17 ENCOUNTER — Encounter: Payer: Self-pay | Admitting: Physician Assistant

## 2016-12-17 VITALS — BP 104/69 | HR 96 | Temp 98.4°F | Resp 18 | Wt 174.9 lb

## 2016-12-17 DIAGNOSIS — F5105 Insomnia due to other mental disorder: Secondary | ICD-10-CM | POA: Diagnosis not present

## 2016-12-17 DIAGNOSIS — F320 Major depressive disorder, single episode, mild: Secondary | ICD-10-CM

## 2016-12-17 DIAGNOSIS — K2960 Other gastritis without bleeding: Secondary | ICD-10-CM

## 2016-12-17 DIAGNOSIS — T39395A Adverse effect of other nonsteroidal anti-inflammatory drugs [NSAID], initial encounter: Secondary | ICD-10-CM

## 2016-12-17 DIAGNOSIS — R5383 Other fatigue: Secondary | ICD-10-CM | POA: Diagnosis not present

## 2016-12-17 DIAGNOSIS — F418 Other specified anxiety disorders: Secondary | ICD-10-CM | POA: Insufficient documentation

## 2016-12-17 DIAGNOSIS — F99 Mental disorder, not otherwise specified: Secondary | ICD-10-CM

## 2016-12-17 DIAGNOSIS — G4700 Insomnia, unspecified: Secondary | ICD-10-CM | POA: Insufficient documentation

## 2016-12-17 HISTORY — DX: Major depressive disorder, single episode, mild: F32.0

## 2016-12-17 MED ORDER — TRAZODONE HCL 50 MG PO TABS: 25.0000 mg | ORAL_TABLET | Freq: Every evening | ORAL | 2 refills | Status: DC | PRN

## 2016-12-17 MED ORDER — OMEPRAZOLE 20 MG PO CPDR: 20.0000 mg | DELAYED_RELEASE_CAPSULE | Freq: Every day | ORAL | 5 refills | Status: DC

## 2016-12-17 NOTE — Progress Notes (Signed)
HPI:                                                                Sherry Romero is a 51 y.o. female who presents to [facility]: [place] today for sleep disturbance  Insomnia  Primary symptoms: difficulty falling asleep, malaise/fatigue.  The current episode started 1 to 4 weeks ago. The onset quality is undetermined. The problem occurs nightly. The problem is unchanged. The symptoms are aggravated by family issues and emotional upset. How many beverages per day that contain caffeine: 0 - 1.  Past treatments include medication (Melatonin). The treatment provided no relief. Typical bedtime:  10-11 P.M..  How long after going to bed to you fall asleep: over an hour.   PMH includes: depression, family stress or anxiety, apnea. Prior workup: sleep study.  Patient reports increased financial stress related to summer break; she works in the [employer] and does not get paid in June and July. She also reports familial stress with a recent disagreement with her adult son, who is not currently speaking to her. This has caused her a lot of sadness.  Denies symptoms of mania/hypomania. Denies suicidal thinking. Denies auditory/visual hallucinations.    Past Medical History:  Diagnosis Date  . Breast cancer ([place])   . Cancer ([place])   . Hyperlipidemia   . Obesity   . OSA (obstructive sleep apnea)   . Osteoporosis   . Thyroid disease    Past Surgical History:  Procedure Laterality Date  . ABDOMINAL HYSTERECTOMY     TAH - no cancer  . ABDOMINOPLASTY    . APPENDECTOMY    . BREAST RECONSTRUCTION    . CESAREAN SECTION     x 3  . MASTECTOMY Bilateral    Social History  Substance Use Topics  . Smoking status: Never Smoker  . Smokeless tobacco: Never Used  . Alcohol use 1.2 oz/week    2 Glasses of wine per week   family history includes Heart disease in her maternal grandfather and paternal grandfather; Hyperlipidemia in her father and  mother; Hypertension in her father, maternal grandfather, maternal grandmother, mother, paternal grandfather, and paternal grandmother; Stroke in her mother.  ROS: Review of Systems  Constitutional: Positive for malaise/fatigue and weight loss (intentional). Negative for chills and fever.  Respiratory: Positive for apnea. Negative for cough and shortness of breath.        + sleep apnea  Cardiovascular: Negative.   Gastrointestinal: Positive for abdominal pain (epigastrum discomfort). Negative for blood in stool, diarrhea, nausea and vomiting.  Genitourinary: Negative.   Musculoskeletal: Positive for myalgias.  Skin: Negative.   Psychiatric/Behavioral: Positive for depression. The patient has insomnia.      Medications: Current Outpatient Prescriptions  Medication Sig Dispense Refill  . naproxen (NAPROSYN) 375 MG tablet Take 375 mg by mouth 2 (two) times daily with a meal.    . atorvastatin (LIPITOR) 20 MG tablet TAKE 1 TABLET BY MOUTH DAILY 90 tablet 0  . Biotin 800 MCG TABS Take 800 mcg by mouth.    . co-enzyme Q-10 30 MG capsule Take 100 mg by mouth daily.    . cyclobenzaprine (FLEXERIL) 10 MG tablet Take 1 tablet (10 mg total) by mouth at bedtime. 30 tablet  11  . denosumab (PROLIA) 60 MG/ML SOLN injection Inject 60 mg into the skin every 6 (six) months. Administer in upper arm, thigh, or abdomen    . magnesium oxide (MAG-OX) 400 MG tablet Take 400 mg by mouth daily.    . Menthol, Topical Analgesic, (BIOFREEZE ROLL-ON COLORLESS) 4 % GEL Roll on topically to the affected area twice a day to 3 times a day    . omeprazole (PRILOSEC) 20 MG capsule Take 1 capsule (20 mg total) by mouth daily. 30 capsule 5  . SYNTHROID 50 MCG tablet TAKE 1 TABLET BY MOUTH EVERY DAY 90 tablet 0  . traZODone (DESYREL) 50 MG tablet Take 0.5-1 tablets (25-50 mg total) by mouth at bedtime as needed for sleep. 30 tablet 2   No current facility-administered medications for this visit.    Allergies  Allergen  Reactions  . Pollen Extract Shortness Of Breath    [Other] asthma  . Bee Pollen Other (See Comments)    Allergic to bees  -Shock] anaphylaxis  . Latex Rash    [Rash] red rash (gloves, and tape products)  . Letrozole Other (See Comments)    Muscle spasms allergic to generic only  . Levothyroxine Nausea Only    Only occurs with generic, not name brand  . Pork Allergy Nausea Only    GI upset  . Tape Rash    [Rash] [Other] red/swollen       Objective:  BP 104/69   Pulse 96   Temp 98.4 F (36.9 C)   Resp 18   Wt 174 lb 14.4 oz (79.3 kg)   BMI 35.33 kg/m  Gen:  not ill-appearing, no distress HEENT: normal conjunctiva, wearing glasses, neck supple, trachea midline Pulm: Normal work of breathing, normal phonation, clear to auscultation bilaterally, no wheezes, rales or rhonchi CV: Normal rate, regular rhythm, s1 and s2 distinct, no murmurs, clicks or rubs  GI: abdomen obese, soft, nondistended, there is epigastric tenderness, no rebound, no guarding Neuro: alert and oriented x 3, EOM's intact, no tremor MSK: moving all extremities, normal gait and station, no peripheral edema Lymph: no cervical or tonsillar adenopathy, no supraclavicular adenopathy Skin: warm, dry, intact; no rashes or lesions on exposed skin, no cyanosis Psych: well-groomed, cooperative, good eye contact, tearful in the exam room, affect is mood-congruent, normal speech, thought process is rumination   No results found for this or any previous visit (from the past 72 hour(s)). No results found. Depression screen [facility] 2/9 12/17/2016 09/24/2016  Decreased Interest 1 0  Down, Depressed, Hopeless 2 0  PHQ - 2 Score 3 0  Altered sleeping 2 -  Tired, decreased energy 2 -  Change in appetite 0 -  Feeling bad or failure about yourself  0 -  Trouble concentrating 0 -  Moving slowly or fidgety/restless 0 -  Suicidal thoughts 0 -  PHQ-9 Score 7 -   GAD 7 : Generalized Anxiety Score 12/17/2016  Nervous, Anxious, on  Edge 2  Control/stop worrying 1  Worry too much - different things 3  Trouble relaxing 1  Restless 0  Easily annoyed or irritable 1  Afraid - awful might happen 2  Total GAD 7 Score 10       Assessment and Plan: 51 y.o. female with   1. Fatigue, unspecified type - CBC - Comprehensive metabolic panel - C-reactive protein - Ferritin - Sedimentation rate - TSH - Vitamin B12 - Vit D  25 hydroxy (rtn osteoporosis monitoring)  2. Insomnia due  to other mental disorder - traZODone (DESYREL) 50 MG tablet; Take 0.5-1 tablets (25-50 mg total) by mouth at bedtime as needed for sleep.  Dispense: 30 tablet; Refill: 2 - encouraged sleep hygiene - continue nightly CPAP  3. Current mild episode of major depressive disorder without prior episode (HCC) - PHQ9 score 7, mild - Ambulatory referral to Psychiatry for counseling  4. Anxiety associated with depression - GAD7 score 10, moderate - Ambulatory referral to Psychiatry for counseling  5. NSAID induced gastritis - omeprazole (PRILOSEC) 20 MG capsule; Take 1 capsule (20 mg total) by mouth daily.  Dispense: 30 capsule; Refill: 5  Patient education and anticipatory guidance given Patient agrees with treatment plan Follow-up in 4 weeks or sooner as needed if symptoms worsen or fail to improve  Darlyne Russian PA-C

## 2016-12-17 NOTE — Patient Instructions (Addendum)
For sleep: - Continue your CPAP nightly - Take Trazodone 1/2 - 1 tablet, 30 minutes before bedtime. Make sure to give yourself at least 7-9 hours for adequate rest to avoid over-sedation the next day  For anxiety/mood; - I have placed a referral for counseling. You will receive a phone call to make an appointment - Follow-up in 4 weeks  If you experience any worsening suicidal thoughts, go to the nearest emergency room or call [place] 1-800-SUICIDE      Sleep Hygiene . Limiting daytime naps to 30 minutes . Napping does not make up for inadequate nighttime sleep. However, a short nap of 20-30 minutes can help to improve mood, alertness and performance.  . Avoiding stimulants such as  caffeine and nicotine close to bedtime.  And when it comes to alcohol, moderation is key 4. While alcohol is well-known to help you fall asleep faster, too much close to bedtime can disrupt sleep in the second half of the night as the body begins to process the alcohol.    . Exercising to promote good quality sleep.  As little as 10 minutes of aerobic exercise, such as walking or cycling, can drastically improve nighttime sleep quality.  For the best night's sleep, most people should avoid strenuous workouts close to bedtime. However, the effect of intense nighttime exercise on sleep differs from person to person, so find out what works best for you.   . Steering clear of food that can be disruptive right before sleep.   Heavy or rich foods, fatty or fried meals, spicy dishes, citrus fruits, and carbonated drinks can trigger indigestion for some people. When this occurs close to bedtime, it can lead to painful heartburn that disrupts sleep. . Ensuring adequate exposure to natural light.  This is particularly important for individuals who may not venture outside frequently. Exposure to sunlight during the day, as well as darkness at night, helps to maintain a healthy sleep-wake cycle  . Marland Kitchen Establishing a regular relaxing bedtime routine.  A regular nightly routine helps the body recognize that it is bedtime. This could include taking warm shower or bath, reading a book, or light stretches. When possible, try to avoid emotionally upsetting conversations and activities before attempting to sleep. . Making sure that the sleep environment is pleasant.  Mattress and pillows should be comfortable. The bedroom should be cool - between 60 and 67 degrees - for optimal sleep. Bright light from lamps, cell phone and TV screens can make it difficult to fall asleep4, so turn those light off or adjust them when possible. Consider using blackout curtains, eye shades, ear plugs, "white noise" machines, humidifiers, fans and other devices that can make the bedroom more relaxing.   Living With Anxiety After being diagnosed with an anxiety disorder, you may be relieved to know why you have felt or behaved a certain way. It is natural to also feel overwhelmed about the treatment ahead and what it will mean for your life. With care and support, you can manage this condition and recover from it. How to cope with anxiety Dealing with stress Stress is your body's reaction to life changes and events, both good and bad. Stress can last just a few hours or it can be ongoing. Stress can play a major role in anxiety, so it is important to learn both how to cope with stress and how to think about it differently. Talk with your health care provider or a counselor to learn more about  stress reduction. He or she may suggest some stress reduction techniques, such as:  Music therapy. This can include creating or listening to music that you enjoy and that inspires you.  Mindfulness-based meditation. This involves being aware of your normal breaths, rather than trying to control your breathing. It can be done while sitting or walking.  Centering prayer. This is a kind of meditation that involves focusing on a word,  phrase, or sacred image that is meaningful to you and that brings you peace.  Deep breathing. To do this, expand your stomach and inhale slowly through your nose. Hold your breath for 3-5 seconds. Then exhale slowly, allowing your stomach muscles to relax.  Self-talk. This is a skill where you identify thought patterns that lead to anxiety reactions and correct those thoughts.  Muscle relaxation. This involves tensing muscles then relaxing them.  Choose a stress reduction technique that fits your lifestyle and personality. Stress reduction techniques take time and practice. Set aside 5-15 minutes a day to do them. Therapists can offer training in these techniques. The training may be covered by some insurance plans. Other things you can do to manage stress include:  Keeping a stress diary. This can help you learn what triggers your stress and ways to control your response.  Thinking about how you respond to certain situations. You may not be able to control everything, but you can control your reaction.  Making time for activities that help you relax, and not feeling guilty about spending your time in this way.  Therapy combined with coping and stress-reduction skills provides the best chance for successful treatment. Medicines Medicines can help ease symptoms. Medicines for anxiety include:  Anti-anxiety drugs.  Antidepressants.  Beta-blockers.  Medicines may be used as the main treatment for anxiety disorder, along with therapy, or if other treatments are not working. Medicines should be prescribed by a health care provider. Relationships Relationships can play a big part in helping you recover. Try to spend more time connecting with trusted friends and family members. Consider going to couples counseling, taking family education classes, or going to family therapy. Therapy can help you and others better understand the condition. How to recognize changes in your condition Everyone has  a different response to treatment for anxiety. Recovery from anxiety happens when symptoms decrease and stop interfering with your daily activities at home or work. This may mean that you will start to:  Have better concentration and focus.  Sleep better.  Be less irritable.  Have more energy.  Have improved memory.  It is important to recognize when your condition is getting worse. Contact your health care provider if your symptoms interfere with home or work and you do not feel like your condition is improving. Where to find help and support: You can get help and support from these sources:  Self-help groups.  Online and [employer].  A trusted spiritual leader.  Couples counseling.  Family education classes.  Family therapy.  Follow these instructions at home:  Eat a healthy diet that includes plenty of vegetables, fruits, whole grains, low-fat dairy products, and lean protein. Do not eat a lot of foods that are high in solid fats, added sugars, or salt.  Exercise. Most adults should do the following: ? Exercise for at least 150 minutes each week. The exercise should increase your heart rate and make you sweat (moderate-intensity exercise). ? Strengthening exercises at least twice a week.  Cut down on caffeine, tobacco, alcohol, and other potentially  harmful substances.  Get the right amount and quality of sleep. Most adults need 7-9 hours of sleep each night.  Make choices that simplify your life.  Take over-the-counter and prescription medicines only as told by your health care provider.  Avoid caffeine, alcohol, and certain over-the-counter cold medicines. These may make you feel worse. Ask your pharmacist which medicines to avoid.  Keep all follow-up visits as told by your health care provider. This is important. Questions to ask your health care provider  Would I benefit from therapy?  How often should I follow up with a health care  provider?  How long do I need to take medicine?  Are there any long-term side effects of my medicine?  Are there any alternatives to taking medicine? Contact a health care provider if:  You have a hard time staying focused or finishing daily tasks.  You spend many hours a day feeling worried about everyday life.  You become exhausted by worry.  You start to have headaches, feel tense, or have nausea.  You urinate more than normal.  You have diarrhea. Get help right away if:  You have a racing heart and shortness of breath.  You have thoughts of hurting yourself or others. If you ever feel like you may hurt yourself or others, or have thoughts about taking your own life, get help right away. You can go to your nearest emergency department or call:  Your local emergency services (911 in the [place]  A suicide crisis helpline, such as the [place] at 605-403-7957. This is open 24-hours a day.  Summary  Taking steps to deal with stress can help calm you.  Medicines cannot cure anxiety disorders, but they can help ease symptoms.  Family, friends, and partners can play a big part in helping you recover from an anxiety disorder. This information is not intended to replace advice given to you by your health care provider. Make sure you discuss any questions you have with your health care provider. Document Released: 06/05/2016 Document Revised: 06/05/2016 Document Reviewed: 06/05/2016 Elsevier Interactive Patient Education  [employer].

## 2016-12-18 LAB — CBC
HCT: 39.4 % (ref 35.0–45.0)
Hemoglobin: 13 g/dL (ref 11.7–15.5)
MCH: 29.6 pg (ref 27.0–33.0)
MCHC: 33 g/dL (ref 32.0–36.0)
MCV: 89.7 fL (ref 80.0–100.0)
MPV: 9.7 fL (ref 7.5–12.5)
PLATELETS: 336 10*3/uL (ref 140–400)
RBC: 4.39 MIL/uL (ref 3.80–5.10)
RDW: 13.1 % (ref 11.0–15.0)
WBC: 6.3 10*3/uL (ref 3.8–10.8)

## 2016-12-19 ENCOUNTER — Encounter: Payer: Self-pay | Admitting: Physician Assistant

## 2016-12-19 LAB — COMPREHENSIVE METABOLIC PANEL
ALT: 19 U/L (ref 6–29)
AST: 14 U/L (ref 10–35)
Albumin: 4.2 g/dL (ref 3.6–5.1)
Alkaline Phosphatase: 89 U/L (ref 33–130)
BUN: 20 mg/dL (ref 7–25)
CHLORIDE: 99 mmol/L (ref 98–110)
CO2: 27 mmol/L (ref 20–31)
CREATININE: 0.83 mg/dL (ref 0.50–1.05)
Calcium: 9.5 mg/dL (ref 8.6–10.4)
Glucose, Bld: 84 mg/dL (ref 65–99)
Potassium: 4.3 mmol/L (ref 3.5–5.3)
SODIUM: 137 mmol/L (ref 135–146)
TOTAL PROTEIN: 7.1 g/dL (ref 6.1–8.1)
Total Bilirubin: 0.4 mg/dL (ref 0.2–1.2)

## 2016-12-19 LAB — C-REACTIVE PROTEIN: CRP: 5 mg/L (ref <8.0)

## 2016-12-19 LAB — VITAMIN D 25 HYDROXY (VIT D DEFICIENCY, FRACTURES): VIT D 25 HYDROXY: 43 ng/mL (ref 30–100)

## 2016-12-19 LAB — SEDIMENTATION RATE: Sed Rate: 16 mm/hr (ref 0–30)

## 2016-12-19 LAB — FERRITIN: Ferritin: 57 ng/mL (ref 10–232)

## 2016-12-19 LAB — TSH: TSH: 1.08 m[IU]/L

## 2016-12-19 LAB — VITAMIN B12: VITAMIN B 12: 845 pg/mL (ref 200–1100)

## 2016-12-19 NOTE — Progress Notes (Signed)
Hi Nandika,  Your labs look perfect. There is no evidence of an inflammatory disorder, anemia, or vitamin deficiency. Your thyroid function is normal. Your fatigue is most likely due to stress and not restful sleep. Please take the sleep medication and follow-up in 4 weeks.  Best, Evlyn Clines

## 2016-12-20 ENCOUNTER — Encounter: Payer: Self-pay | Admitting: Physician Assistant

## 2016-12-24 ENCOUNTER — Ambulatory Visit (INDEPENDENT_AMBULATORY_CARE_PROVIDER_SITE_OTHER): Payer: BC Managed Care – PPO | Admitting: Physician Assistant

## 2016-12-24 ENCOUNTER — Encounter: Payer: Self-pay | Admitting: Physician Assistant

## 2016-12-24 VITALS — BP 111/74 | HR 84 | Temp 97.5°F | Ht 61.0 in | Wt 176.0 lb

## 2016-12-24 DIAGNOSIS — J00 Acute nasopharyngitis [common cold]: Secondary | ICD-10-CM

## 2016-12-24 LAB — POCT RAPID STREP A (OFFICE): RAPID STREP A SCREEN: NEGATIVE

## 2016-12-24 MED ORDER — IPRATROPIUM BROMIDE 0.06 % NA SOLN: 1.0000 | Freq: Four times a day (QID) | NASAL | 0 refills | Status: DC | PRN

## 2016-12-24 NOTE — Patient Instructions (Signed)
-   Atrovent nasal spray up to 4 times daily as needed for nasal congestion - Cepacol throat lozenges - Warm salt water gargles - Tylenol 1000mg  every 8 hours - Push lots of clear liquids  Pharyngitis Pharyngitis is redness, pain, and swelling (inflammation) of your pharynx. What are the causes? Pharyngitis is usually caused by infection. Most of the time, these infections are from viruses (viral) and are part of a cold. However, sometimes pharyngitis is caused by bacteria (bacterial). Pharyngitis can also be caused by allergies. Viral pharyngitis may be spread from person to person by coughing, sneezing, and personal items or utensils (cups, forks, spoons, toothbrushes). Bacterial pharyngitis may be spread from person to person by more intimate contact, such as kissing. What are the signs or symptoms? Symptoms of pharyngitis include:  Sore throat.  Tiredness (fatigue).  Low-grade fever.  Headache.  Joint pain and muscle aches.  Skin rashes.  Swollen lymph nodes.  Plaque-like film on throat or tonsils (often seen with bacterial pharyngitis).  How is this diagnosed? Your health care provider will ask you questions about your illness and your symptoms. Your medical history, along with a physical exam, is often all that is needed to diagnose pharyngitis. Sometimes, a rapid strep test is done. Other lab tests may also be done, depending on the suspected cause. How is this treated? Viral pharyngitis will usually get better in 3-4 days without the use of medicine. Bacterial pharyngitis is treated with medicines that kill germs (antibiotics). Follow these instructions at home:  Drink enough water and fluids to keep your urine clear or pale yellow.  Only take over-the-counter or prescription medicines as directed by your health care provider: ? If you are prescribed antibiotics, make sure you finish them even if you start to feel better. ? Do not take aspirin.  Get lots of  rest.  Gargle with 8 oz of salt water ( tsp of salt per 1 qt of water) as often as every 1-2 hours to soothe your throat.  Throat lozenges (if you are not at risk for choking) or sprays may be used to soothe your throat. Contact a health care provider if:  You have large, tender lumps in your neck.  You have a rash.  You cough up green, yellow-brown, or bloody spit. Get help right away if:  Your neck becomes stiff.  You drool or are unable to swallow liquids.  You vomit or are unable to keep medicines or liquids down.  You have severe pain that does not go away with the use of recommended medicines.  You have trouble breathing (not caused by a stuffy nose). This information is not intended to replace advice given to you by your health care provider. Make sure you discuss any questions you have with your health care provider. Document Released: 06/11/2005 Document Revised: 11/17/2015 Document Reviewed: 02/16/2013 Elsevier Interactive Patient Education  2017 [employer].

## 2016-12-24 NOTE — Progress Notes (Signed)
HPI:                                                                Sherry Romero is a 51 y.o. female who presents to [place]: [place] today for sore throat  Sore Throat   This is a new problem. The current episode started in the past 7 days. The problem has been unchanged. Neither side of throat is experiencing more pain than the other. There has been no fever. The pain is mild. Associated symptoms include congestion, ear discharge (right, blood) and a plugged ear sensation (right). Pertinent negatives include no coughing, ear pain, neck pain, shortness of breath, stridor or trouble swallowing. She has had no exposure to strep or mono.  Patient reports her boyfriend was diagnosed with thrush and she wants to make sure she doesn't have it.   Past Medical History:  Diagnosis Date  . Breast cancer ([place])   . Cancer ([place])   . Hyperlipidemia   . Obesity   . OSA (obstructive sleep apnea)   . Osteoporosis   . Thyroid disease    Past Surgical History:  Procedure Laterality Date  . ABDOMINAL HYSTERECTOMY     TAH - no cancer  . ABDOMINOPLASTY    . APPENDECTOMY    . BREAST RECONSTRUCTION    . CESAREAN SECTION     x 3  . MASTECTOMY Bilateral    Social History  Substance Use Topics  . Smoking status: Never Smoker  . Smokeless tobacco: Never Used  . Alcohol use 1.2 oz/week    2 Glasses of wine per week   family history includes Heart disease in her maternal grandfather and paternal grandfather; Hyperlipidemia in her father and mother; Hypertension in her father, maternal grandfather, maternal grandmother, mother, paternal grandfather, and paternal grandmother; Stroke in her mother.  ROS: negative except as noted in the HPI  Medications: Current Outpatient Prescriptions  Medication Sig Dispense Refill  . atorvastatin (LIPITOR) 20 MG tablet TAKE 1 TABLET BY MOUTH DAILY 90 tablet 0  . Biotin 800 MCG TABS Take 800 mcg by mouth.    .  co-enzyme Q-10 30 MG capsule Take 100 mg by mouth daily.    . cyclobenzaprine (FLEXERIL) 10 MG tablet Take 1 tablet (10 mg total) by mouth at bedtime. 30 tablet 11  . denosumab (PROLIA) 60 MG/ML SOLN injection Inject 60 mg into the skin every 6 (six) months. Administer in upper arm, thigh, or abdomen    . magnesium oxide (MAG-OX) 400 MG tablet Take 400 mg by mouth daily.    . Menthol, Topical Analgesic, (BIOFREEZE ROLL-ON COLORLESS) 4 % GEL Roll on topically to the affected area twice a day to 3 times a day    . naproxen (NAPROSYN) 375 MG tablet Take 375 mg by mouth 2 (two) times daily with a meal.    . omeprazole (PRILOSEC) 20 MG capsule Take 1 capsule (20 mg total) by mouth daily. 30 capsule 5  . SYNTHROID 50 MCG tablet TAKE 1 TABLET BY MOUTH EVERY DAY 90 tablet 0  . traZODone (DESYREL) 50 MG tablet Take 0.5-1 tablets (25-50 mg total) by mouth at bedtime as needed for sleep. 30 tablet 2   No current facility-administered medications for this visit.  Allergies  Allergen Reactions  . Pollen Extract Shortness Of Breath    [Other] asthma  . Bee Pollen Other (See Comments)    Allergic to bees  -Shock] anaphylaxis  . Latex Rash    [Rash] red rash (gloves, and tape products)  . Letrozole Other (See Comments)    Muscle spasms allergic to generic only  . Levothyroxine Nausea Only    Only occurs with generic, not name brand  . Pork Allergy Nausea Only    GI upset  . Tape Rash    [Rash] [Other] red/swollen       Objective:  BP 111/74   Pulse 84   Temp 97.5 F (36.4 C) (Oral)   Ht 5\' 1"  (1.549 m)   Wt 176 lb (79.8 kg)   BMI 33.25 kg/m  Gen: well-groomed, cooperative, not ill-appearing, no distress HEENT: normal conjunctiva, wearing glasses, TM's clear bilaterally, nasal mucosa edematous, oropharynx clear, moist mucus membranes, no frontal or maxillary sinus tenderness, neck supple, trachea midline Pulm: Normal work of breathing, normal phonation, clear to auscultation bilaterally,  no wheezes, rales or rhonchi CV: Normal rate, regular rhythm, s1 and s2 distinct, no murmurs, clicks or rubs  Neuro: alert and oriented x 3, EOM's intact, no tremor MSK: moving all extremities, normal gait and station, no peripheral edema Lymph: no cervical or tonsillar adenopathy Skin: warm, dry, intact; no rashes or lesions on exposed skin, no cyanosis   Results for orders placed or performed in visit on 12/24/16 (from the past 72 hour(s))  POCT rapid strep A     Status: None   Collection Time: 12/24/16  8:54 AM  Result Value Ref Range   Rapid Strep A Screen Negative Negative   No results found.    Assessment and Plan: 51 y.o. female with   1. Nasopharyngitis - POCT rapid strep A negative - symptomatic management - ipratropium (ATROVENT) 0.06 % nasal spray; Place 1 spray into both nostrils 4 (four) times daily as needed.  Dispense: 15 mL; Refill: 0  Patient education and anticipatory guidance given Patient agrees with treatment plan Follow-up as needed if symptoms worsen or fail to improve  Darlyne Russian PA-C

## 2017-01-14 ENCOUNTER — Ambulatory Visit (HOSPITAL_COMMUNITY): Payer: BC Managed Care – PPO | Admitting: Licensed Clinical Social Worker

## 2017-01-17 ENCOUNTER — Encounter: Payer: Self-pay | Admitting: Physician Assistant

## 2017-01-18 ENCOUNTER — Ambulatory Visit: Payer: BC Managed Care – PPO | Admitting: Physician Assistant

## 2017-01-21 ENCOUNTER — Other Ambulatory Visit: Payer: Self-pay | Admitting: Physician Assistant

## 2017-02-06 ENCOUNTER — Encounter: Payer: Self-pay | Admitting: Physician Assistant

## 2017-02-13 ENCOUNTER — Encounter: Payer: Self-pay | Admitting: Physician Assistant

## 2017-02-13 ENCOUNTER — Ambulatory Visit (INDEPENDENT_AMBULATORY_CARE_PROVIDER_SITE_OTHER): Payer: BC Managed Care – PPO | Admitting: Physician Assistant

## 2017-02-13 VITALS — BP 110/75 | HR 81 | Temp 98.1°F | Ht 61.0 in | Wt 180.0 lb

## 2017-02-13 DIAGNOSIS — L299 Pruritus, unspecified: Secondary | ICD-10-CM

## 2017-02-13 DIAGNOSIS — R0981 Nasal congestion: Secondary | ICD-10-CM

## 2017-02-13 MED ORDER — ACETIC ACID 2 % OT SOLN: 5.0000 [drp] | Freq: Three times a day (TID) | OTIC | 0 refills | Status: DC

## 2017-02-13 MED ORDER — IPRATROPIUM BROMIDE 0.06 % NA SOLN: 1.0000 | Freq: Four times a day (QID) | NASAL | 0 refills | Status: DC | PRN

## 2017-02-13 NOTE — Patient Instructions (Signed)
Cataract A cataract is cloudiness on the lens of your eye. The lens is the clear part of your eye that is behind your iris and pupil. The lens focuses light on the retina, which lets you see clearly. When a lens becomes cloudy, vision may become blurry. The clouding can range from a tiny dot to complete cloudiness. As some cataracts develop, they make a person more nearsighted. Other cataracts increase glare. Cataracts can worsen over time, and sometimes the pupil can look white. Cataracts get bigger and they cloud more of the lens, making it difficult to see. Cataracts can affect one eye or both eyes. What are the causes? Most cataracts are associated with age-related eye changes. The eye lens is mostly made up of water and protein. Normally, this protein is arranged in a way that keeps the lens clear. Cataracts develop when protein begins to clump together over time. This clouds the lens and lets less light pass through to the retina, which causes blurry vision. What increases the risk? This condition is more likely to develop in people who:  Are 68 years of age or older.  Have diabetes.  Have high blood pressure.  Takecertain medicines, such as steroids or hormone replacement therapy.  Have had an eye injury.  Have or have had eye inflammation.  Have a family history of cataracts.  Smoke.  Drink alcohol heavily.  Are frequently exposed to sun or very strong light without eye protection.  Are obese.  Have been exposed to large amounts of radiation, lead, or other toxic substances.  Have had eye surgery.  What are the signs or symptoms? The main symptom of a cataract is blurry vision. Your vision may change or get worse over time. Other symptoms include:  Increased glare.  Seeing a bright ring or halo around light.  Poor night vision.  Double vision in one eye.  Having trouble seeing, even while wearing contact lenses or glasses.  Seeing colors that appear  faded.  Trouble telling the difference between blue and purple.  Needing frequent changes to your prescription glasses or contacts.  How is this diagnosed? This condition is diagnosed with a medical history and eye exam. You may need to see an eye specialist (optometrist or ophthalmologist). Your health care provider may enlarge (dilate) your pupils with eye drops to see the back of your eye more clearly and look for signs of cataracts or other damage. You may also have tests, including:  A visual acuity test. This uses a chart to determine the smallest letters that you can see from a specific distance.  A slit-lamp exam. This uses a microscope to examine small sections of your eye for abnormalities.  Tonometry. This test measures the pressure of the fluid inside your eye.  How is this treated? Treatment depends on the stage of your cataract. For an early cataract, vision may improve by using different eyeglasses or stronger lighting. If that does not help your vision, surgery may be recommended to remove the cataract. If your health care provider thinks your cataract may be linked to any medicines that you are taking, he or she may change your medicines. Follow these instructions at home: Lifestyle  Use stronger or brighter lighting.  Consider using a magnifying glass for reading or other activities.  Become familiar with your surroundings. Having poor vision can put you at a greater risk for tripping, falling, or bumping into things.  Wear sunglasses and a hat if you are sensitive to bright light  or are having problems with glare.  Quit smoking if you smoke. If you need help quitting, talk with your health care provider. General instructions  If you are prescribed new eyeglasses, wear them as told by your health care provider.  Take over-the-counter and prescription medicines only as told by your health care provider. Do not change your medicines unless told by your health care  provider.  Do not drive or operate heavy machinery if your vision is blurry, particularly at night.  Keep your blood sugar under control, if you have diabetes.  Keep all follow-up visits as told by your health care provider. This is important. Contact a health care provider if:  Your symptoms get worse.  Your vision affects your ability to perform daily activities.  You have new symptoms.  You have a fever. Get help right away if:  You have sudden vision loss.  You have redness, swelling, or increasing pain in your eye.  You develop a headache and sensitivity to light. This information is not intended to replace advice given to you by your health care provider. Make sure you discuss any questions you have with your health care provider. Document Released: 06/11/2005 Document Revised: 10/20/2015 Document Reviewed: 12/15/2014 Elsevier Interactive Patient Education  2017 [employer].

## 2017-02-13 NOTE — Progress Notes (Signed)
HPI:                                                                Sherry Romero is a 51 y.o. female who presents to [place]: [facility] today for bilateral ear itching  Patient reports bilateral inner ear itching x 3 days. Denies otalgia or drainage. Denies hearing loss. Denies fever, chills, malaise. She does endorse nasal congestion. Has been using hydrogen peroxide and Qtips without relief.  Past Medical History:  Diagnosis Date  . Breast cancer ([place])   . Cancer ([place])   . Hyperlipidemia   . Obesity   . OSA (obstructive sleep apnea)   . Osteoporosis   . Thyroid disease    Past Surgical History:  Procedure Laterality Date  . ABDOMINAL HYSTERECTOMY     TAH - no cancer  . ABDOMINOPLASTY    . APPENDECTOMY    . BREAST RECONSTRUCTION    . CESAREAN SECTION     x 3  . MASTECTOMY Bilateral    Social History  Substance Use Topics  . Smoking status: Never Smoker  . Smokeless tobacco: Never Used  . Alcohol use 1.2 oz/week    2 Glasses of wine per week   family history includes Heart disease in her maternal grandfather and paternal grandfather; Hyperlipidemia in her father and mother; Hypertension in her father, maternal grandfather, maternal grandmother, mother, paternal grandfather, and paternal grandmother; Stroke in her mother.  ROS: negative except as noted in the HPI  Medications: Current Outpatient Prescriptions  Medication Sig Dispense Refill  . atorvastatin (LIPITOR) 20 MG tablet TAKE 1 TABLET BY MOUTH DAILY 90 tablet 0  . Biotin 800 MCG TABS Take 800 mcg by mouth.    . co-enzyme Q-10 30 MG capsule Take 100 mg by mouth daily.    . cyclobenzaprine (FLEXERIL) 10 MG tablet Take 1 tablet (10 mg total) by mouth at bedtime. 30 tablet 11  . denosumab (PROLIA) 60 MG/ML SOLN injection Inject 60 mg into the skin every 6 (six) months. Administer in upper arm, thigh, or abdomen    . ipratropium (ATROVENT) 0.06 % nasal spray Place 1  spray into both nostrils 4 (four) times daily as needed. 15 mL 0  . magnesium oxide (MAG-OX) 400 MG tablet Take 400 mg by mouth daily.    . Menthol, Topical Analgesic, (BIOFREEZE ROLL-ON COLORLESS) 4 % GEL Roll on topically to the affected area twice a day to 3 times a day    . naproxen (NAPROSYN) 375 MG tablet Take 375 mg by mouth 2 (two) times daily with a meal.    . omeprazole (PRILOSEC) 20 MG capsule Take 1 capsule (20 mg total) by mouth daily. 30 capsule 5  . SYNTHROID 50 MCG tablet TAKE 1 TABLET BY MOUTH EVERY DAY 90 tablet 0  . traZODone (DESYREL) 50 MG tablet Take 0.5-1 tablets (25-50 mg total) by mouth at bedtime as needed for sleep. 30 tablet 2   No current facility-administered medications for this visit.    Allergies  Allergen Reactions  . Pollen Extract Shortness Of Breath    [Other] asthma  . Bee Pollen Other (See Comments)    Allergic to bees  -Shock] anaphylaxis  . Latex Rash    [Rash] red  rash (gloves, and tape products)  . Letrozole Other (See Comments)    Muscle spasms allergic to generic only  . Levothyroxine Nausea Only    Only occurs with generic, not name brand  . Pork Allergy Nausea Only    GI upset  . Tape Rash    [Rash] [Other] red/swollen       Objective:  BP 110/75   Pulse 81   Temp 98.1 F (36.7 C) (Oral)   Ht 5\' 1"  (1.549 m)   Wt 180 lb (81.6 kg)   BMI 34.01 kg/m  Gen:  alert, not ill-appearing, no distress, appropriate for age HEENT: head normocephalic without obvious abnormality, conjunctiva and cornea clear, wearing glasses, external ear canals without debris or erythema bilaterally, TM's transparent and pearly grey bilaterally, hearing intact, nasal mucosa edematous, trachea midline Pulm: Normal work of breathing, normal phonation Neuro: alert and oriented x 3, no tremor MSK: extremities atraumatic, normal gait and station Skin: intact, no rashes on exposed skin, no jaundice, no cyanosis   No results found for this or any previous  visit (from the past 72 hour(s)). No results found.    Assessment and Plan: 51 y.o. female with   1. Ear itching - acetic acid (VOSOL) 2 % otic solution; Place 5 drops into both ears 3 (three) times daily.  Dispense: 15 mL; Refill: 0   2. Mild nasal congestion - ipratropium (ATROVENT) 0.06 % nasal spray; Place 1 spray into both nostrils 4 (four) times daily as needed.  Dispense: 15 mL; Refill: 0   Patient education and anticipatory guidance given Patient agrees with treatment plan Follow-up as needed if symptoms worsen or fail to improve  Darlyne Russian PA-C

## 2017-02-21 ENCOUNTER — Other Ambulatory Visit: Payer: Self-pay | Admitting: Physician Assistant

## 2017-02-25 ENCOUNTER — Encounter: Payer: Self-pay | Admitting: Sports Medicine

## 2017-02-26 ENCOUNTER — Encounter: Payer: Self-pay | Admitting: Sports Medicine

## 2017-03-05 ENCOUNTER — Ambulatory Visit (INDEPENDENT_AMBULATORY_CARE_PROVIDER_SITE_OTHER): Payer: BC Managed Care – PPO | Admitting: Sports Medicine

## 2017-03-05 ENCOUNTER — Encounter: Payer: Self-pay | Admitting: Sports Medicine

## 2017-03-05 DIAGNOSIS — S39012A Strain of muscle, fascia and tendon of lower back, initial encounter: Secondary | ICD-10-CM

## 2017-03-05 DIAGNOSIS — M5412 Radiculopathy, cervical region: Secondary | ICD-10-CM | POA: Diagnosis not present

## 2017-03-05 MED ORDER — CYCLOBENZAPRINE HCL 10 MG PO TABS: ORAL_TABLET | ORAL | 11 refills | Status: DC

## 2017-03-05 NOTE — Progress Notes (Signed)
   Subjective:    I'm seeing this patient as a consultation for:  Nelson Chimes, PA-C  CC: Low back pain  HPI: Several days ago while at work this pleasant 51 year old female was lifting a box, she felt immediate pain in the right side of her low back, that involves to involve both sides of her back, pain is moderate, persistent, localized without radiation, no bowel or bladder dysfunction, saddle numbness, constitutional symptoms.  Past medical history, Surgical history, Family history not pertinant except as noted below, Social history, Allergies, and medications have been entered into the medical record, reviewed, and no changes needed.   Review of Systems: No headache, visual changes, nausea, vomiting, diarrhea, constipation, dizziness, abdominal pain, skin rash, fevers, chills, night sweats, weight loss, swollen lymph nodes, body aches, joint swelling, muscle aches, chest pain, shortness of breath, mood changes, visual or auditory hallucinations.   Objective:   General: Well Developed, well nourished, and in no acute distress.  Neuro:  Extra-ocular muscles intact, able to move all 4 extremities, sensation grossly intact.  Deep tendon reflexes tested were normal. Psych: Alert and oriented, mood congruent with affect. ENT:  Ears and nose appear unremarkable.  Hearing grossly normal. Neck: Unremarkable overall appearance, trachea midline.  No visible thyroid enlargement. Eyes: Conjunctivae and lids appear unremarkable.  Pupils equal and round. Skin: Warm and dry, no rashes noted.  Cardiovascular: Pulses palpable, no extremity edema. Back Exam:  Inspection: Unremarkable  Motion: Flexion 45 deg, Extension 45 deg, Side Bending to 45 deg bilaterally,  Rotation to 45 deg bilaterally  SLR laying: Negative  XSLR laying: Negative  Palpable tenderness: Tenderness bilaterally in the lower paralumbar musculature and over the sacroiliac joints. FABER: negative. Sensory change: Gross sensation  intact to all lumbar and sacral dermatomes.  Reflexes: 2+ at both patellar tendons, 2+ at achilles tendons, Babinski's downgoing.  Strength at foot  Plantar-flexion: 5/5 Dorsi-flexion: 5/5 Eversion: 5/5 Inversion: 5/5  Leg strength  Quad: 5/5 Hamstring: 5/5 Hip flexor: 5/5 Hip abductors: 5/5  Gait unremarkable.  Impression and Recommendations:   This case required medical decision making of moderate complexity.  Strain of lumbar paraspinal muscle She is working with the nurse practitioner assigned to her for her Worker's Comp. injury. She is here mostly for a second opinion, I think she simply has a lumbar strain with underlying degenerative disc disease in the lumbar spine. She would respond most likely to a short burst of prednisone, a muscle relaxer of her choice, as well as a few sessions of formal physical therapy followed by a home exercise program. I would also likely obtain a baseline lumbar spine x-ray. She can return to see me on an as-needed basis.  ___________________________________________ Gwen Her. Dianah Field, M.D., ABFM., CAQSM. Primary Care and [place] Instructor of [place] [facility]

## 2017-03-05 NOTE — Assessment & Plan Note (Signed)
She is working with the nurse practitioner assigned to her for her Worker's Comp. injury. She is here mostly for a second opinion, I think she simply has a lumbar strain with underlying degenerative disc disease in the lumbar spine. She would respond most likely to a short burst of prednisone, a muscle relaxer of her choice, as well as a few sessions of formal physical therapy followed by a home exercise program. I would also likely obtain a baseline lumbar spine x-ray. She can return to see me on an as-needed basis.

## 2017-03-21 DIAGNOSIS — Z5181 Encounter for therapeutic drug level monitoring: Secondary | ICD-10-CM | POA: Insufficient documentation

## 2017-03-25 ENCOUNTER — Encounter: Payer: Self-pay | Admitting: Physician Assistant

## 2017-03-27 ENCOUNTER — Ambulatory Visit (INDEPENDENT_AMBULATORY_CARE_PROVIDER_SITE_OTHER): Payer: BC Managed Care – PPO | Admitting: Physician Assistant

## 2017-03-27 ENCOUNTER — Encounter: Payer: Self-pay | Admitting: Physician Assistant

## 2017-03-27 VITALS — BP 96/65 | HR 87 | Temp 98.4°F | Wt 185.0 lb

## 2017-03-27 DIAGNOSIS — G4763 Sleep related bruxism: Secondary | ICD-10-CM | POA: Diagnosis not present

## 2017-03-27 DIAGNOSIS — H9203 Otalgia, bilateral: Secondary | ICD-10-CM

## 2017-03-27 DIAGNOSIS — M2669 Other specified disorders of temporomandibular joint: Secondary | ICD-10-CM

## 2017-03-27 DIAGNOSIS — Z90710 Acquired absence of both cervix and uterus: Secondary | ICD-10-CM | POA: Insufficient documentation

## 2017-03-27 HISTORY — DX: Otalgia, bilateral: H92.03

## 2017-03-27 NOTE — Patient Instructions (Signed)
-   Tylenol 1000 mg every 8 hours - Soft diet - Avoid teeth clenching - Follow-up with ENT   Earache, Adult An earache, or ear pain, can be caused by many things, including:  An infection.  Ear wax buildup.  Ear pressure.  Something in the ear that should not be there (foreign body).  A sore throat.  Tooth problems.  Jaw problems.  Treatment of the earache will depend on the cause. If the cause is not clear or cannot be determined, you may need to watch your symptoms until your earache goes away or until a cause is found. Follow these instructions at home: Pay attention to any changes in your symptoms. Take these actions to help with your pain:  Take or apply over-the-counter and prescription medicines only as told by your health care provider.  If you were prescribed an antibiotic medicine, use it as told by your health care provider. Do not stop using the antibiotic even if you start to feel better.  Do not put anything in your ear other than medicine that is prescribed by your health care provider.  If directed, apply heat to the affected area as often as told by your health care provider. Use the heat source that your health care provider recommends, such as a moist heat pack or a heating pad. ? Place a towel between your skin and the heat source. ? Leave the heat on for 20-30 minutes. ? Remove the heat if your skin turns bright red. This is especially important if you are unable to feel pain, heat, or cold. You may have a greater risk of getting burned.  If directed, put ice on the ear: ? Put ice in a plastic bag. ? Place a towel between your skin and the bag. ? Leave the ice on for 20 minutes, 2-3 times a day.  Try resting in an upright position instead of lying down. This may help to reduce pressure in your ear and relieve pain.  Chew gum if it helps to relieve your ear pain.  Treat any allergies as told by your health care provider.  Keep all follow-up visits as  told by your health care provider. This is important.  Contact a health care provider if:  Your pain does not improve within 2 days.  Your earache gets worse.  You have new symptoms.  You have a fever. Get help right away if:  You have a severe headache.  You have a stiff neck.  You have trouble swallowing.  You have redness or swelling behind your ear.  You have fluid or blood coming from your ear.  You have hearing loss.  You feel dizzy. This information is not intended to replace advice given to you by your health care provider. Make sure you discuss any questions you have with your health care provider. Document Released: 01/27/2004 Document Revised: 02/07/2016 Document Reviewed: 12/05/2015 Elsevier Interactive Patient Education  [employer].

## 2017-03-27 NOTE — Progress Notes (Signed)
HPI:                                                                Sherry Romero is a 51 y.o. female who presents to [place]: [place] today for bilateral ear pain  This pleasant 51 yo F presents today with bilateral persistent ear pain for 9 weeks. Pain is gradually worsening, right worse than left. She endorses clear drainage from her right ear beginning yesterday. She has tried acetic acid drops without relief. 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 She also wears a nightguard for bruxism.  Past Medical History:  Diagnosis Date  . Breast cancer ([place])   . Cancer ([place])   . Hyperlipidemia   . Obesity   . OSA (obstructive sleep apnea)   . Osteoporosis   . Thyroid disease    Past Surgical History:  Procedure Laterality Date  . ABDOMINAL HYSTERECTOMY     TAH - no cancer  . ABDOMINOPLASTY    . APPENDECTOMY    . BREAST RECONSTRUCTION    . CESAREAN SECTION     x 3  . MASTECTOMY Bilateral    Social History  Substance Use Topics  . Smoking status: Never Smoker  . Smokeless tobacco: Never Used  . Alcohol use 1.2 oz/week    2 Glasses of wine per week   family history includes Heart disease in her maternal grandfather and paternal grandfather; Hyperlipidemia in her father and mother; Hypertension in her father, maternal grandfather, maternal grandmother, mother, paternal grandfather, and paternal grandmother; Stroke in her mother.  ROS: negative except as noted in the HPI  Medications: Current Outpatient Prescriptions  Medication Sig Dispense Refill  . atorvastatin (LIPITOR) 20 MG tablet TAKE 1 TABLET BY MOUTH DAILY 90 tablet 0  . Biotin 800 MCG TABS Take 800 mcg by mouth.    . co-enzyme Q-10 30 MG capsule Take 100 mg by mouth daily.    . cyclobenzaprine (FLEXERIL) 10 MG tablet 1 tab by mouth up to 3 times per day 30 tablet 11  . denosumab (PROLIA) 60 MG/ML SOLN injection Inject 60 mg into  the skin every 6 (six) months. Administer in upper arm, thigh, or abdomen    . ipratropium (ATROVENT) 0.06 % nasal spray Place 1 spray into both nostrils 4 (four) times daily as needed. 15 mL 0  . levothyroxine (SYNTHROID, LEVOTHROID) 50 MCG tablet TAKE 1 TABLET BY MOUTH EVERY DAY 90 tablet 0  . magnesium oxide (MAG-OX) 400 MG tablet Take 400 mg by mouth daily.    . Menthol, Topical Analgesic, (BIOFREEZE ROLL-ON COLORLESS) 4 % GEL Roll on topically to the affected area twice a day to 3 times a day    . naproxen (NAPROSYN) 500 MG tablet Take by mouth.    Marland Kitchen omeprazole (PRILOSEC) 20 MG capsule Take 1 capsule (20 mg total) by mouth daily. 30 capsule 5  . SYNTHROID 50 MCG tablet TAKE 1 TABLET BY MOUTH EVERY DAY 90 tablet 0  . traZODone (DESYREL) 50 MG tablet Take 0.5-1 tablets (25-50 mg total) by mouth at bedtime as needed for sleep. 30 tablet 2   No current facility-administered medications for this visit.    Allergies  Allergen Reactions  .  Pollen Extract Shortness Of Breath    [Other] asthma  . Bee Pollen Other (See Comments)    Allergic to bees  -Shock] anaphylaxis  . Latex Rash    [Rash] red rash (gloves, and tape products)  . Letrozole Other (See Comments)    Muscle spasms allergic to generic only  . Levothyroxine Nausea Only    Only occurs with generic, not name brand  . Pork Allergy Nausea Only    GI upset  . Tape Rash    [Rash] [Other] red/swollen       Objective:  BP 96/65   Pulse 87   Temp 98.4 F (36.9 C) (Oral)   Wt 185 lb (83.9 kg)   BMI 34.96 kg/m  Gen:  alert, not ill-appearing, no distress, appropriate for age 10: head normocephalic without obvious abnormality, right external canal with mild scaling of skin, no edema or debris, TM's are clear bilaterally, no mastoid tenderness or redness, no pre-post-auricular adenopathy, conjunctiva and cornea clear, wearing glasses, there is crepitus of the TMJ left worse than right, tenderness of the right occiput, trachea  midline Pulm: Normal work of breathing, normal phonation, clear to auscultation bilaterally, no wheezes, rales or rhonchi CV: Normal rate, regular rhythm, s1 and s2 distinct, no murmurs, clicks or rubs  Neuro: alert and oriented x 3, no tremor MSK: extremities atraumatic, normal gait and station Skin: intact, no rashes on exposed skin, no jaundice, no cyanosis     No results found for this or any previous visit (from the past 72 hour(s)). No results found.    Assessment and Plan: 51 y.o. female with   1. Otalgia of both ears - normal exam and tympanogram today. Suspect otalgia is secondary to TMJ/bruxism. Recommend follow-up with ENT to r/o ear abnormality. Recommend follow-up with dentist - Ambulatory referral to ENT  2. TMJ inflammation - soft diet - avoid teeth clenching - cont to wear night guard - tylenol 1000 mg every 8 hours - anti-inflammatories  3. Sleep-related bruxism   Patient education and anticipatory guidance given Patient agrees with treatment plan Follow-up as needed if symptoms worsen or fail to improve  Darlyne Russian PA-C

## 2017-04-02 ENCOUNTER — Encounter: Payer: Self-pay | Admitting: Physician Assistant

## 2017-04-05 ENCOUNTER — Encounter: Payer: Self-pay | Admitting: Sports Medicine

## 2017-04-05 ENCOUNTER — Ambulatory Visit (INDEPENDENT_AMBULATORY_CARE_PROVIDER_SITE_OTHER): Payer: BC Managed Care – PPO | Admitting: Sports Medicine

## 2017-04-05 DIAGNOSIS — M67472 Ganglion, left ankle and foot: Secondary | ICD-10-CM | POA: Diagnosis not present

## 2017-04-05 NOTE — Progress Notes (Signed)
   Subjective:    I'm seeing this patient as a consultation for:  Nelson Chimes, PA-C  CC: Mass on left foot  HPI: For months this pleasant 51 year old female has had a mass over the dorsum of her left midfoot, she was seen by podiatry and had a surgical excision of a ganglion cyst, this recurred, here for further evaluation and definitive treatment, has never had an aspiration or injection, gaining cyst is minimally tender.  Past medical history, Surgical history, Family history not pertinant except as noted below, Social history, Allergies, and medications have been entered into the medical record, reviewed, and no changes needed.   Review of Systems: No headache, visual changes, nausea, vomiting, diarrhea, constipation, dizziness, abdominal pain, skin rash, fevers, chills, night sweats, weight loss, swollen lymph nodes, body aches, joint swelling, muscle aches, chest pain, shortness of breath, mood changes, visual or auditory hallucinations.   Objective:   General: Well Developed, well nourished, and in no acute distress.  Neuro:  Extra-ocular muscles intact, able to move all 4 extremities, sensation grossly intact.  Deep tendon reflexes tested were normal. Psych: Alert and oriented, mood congruent with affect. ENT:  Ears and nose appear unremarkable.  Hearing grossly normal. Neck: Unremarkable overall appearance, trachea midline.  No visible thyroid enlargement. Eyes: Conjunctivae and lids appear unremarkable.  Pupils equal and round. Skin: Warm and dry, no rashes noted.  Cardiovascular: Pulses palpable, no extremity edema. Left foot: Dorsal ganglion cyst, well-defined, movable, 1 cm across, minimally tender. Range of motion is full in all directions. Strength is 5/5 in all directions. No hallux valgus. No pes cavus or pes planus. No abnormal callus noted. No pain over the navicular prominence, or base of fifth metatarsal. No tenderness to palpation of the calcaneal insertion of  plantar fascia. No pain at the Achilles insertion. No pain over the calcaneal bursa. No pain of the retrocalcaneal bursa. No tenderness to palpation over the tarsals, metatarsals, or phalanges. No hallux rigidus or limitus. No tenderness palpation over interphalangeal joints. No pain with compression of the metatarsal heads. Neurovascularly intact distally.  Procedure: Real-time Ultrasound Guided aspiration/injection of left dorsal foot ganglion cyst Device: GE Logiq E  Verbal informed consent obtained.  Time-out conducted.  Noted no overlying erythema, induration, or other signs of local infection.  Skin prepped in a sterile fashion.  Local anesthesia: Topical Ethyl chloride.  With sterile technique and under real time ultrasound guidance:  Using an 18-gauge needle aspirated scant amount of thick fluid, syringe switched and 1/2 mL kenalog 40, 1/2 mL lidocaine injected easily Completed without difficulty  Pain immediately resolved suggesting accurate placement of the medication.  Advised to call if fevers/chills, erythema, induration, drainage, or persistent bleeding.  Images permanently stored and available for review in the ultrasound unit.  Impression: Technically successful ultrasound guided injection.  Compression dressing wrapped over the foot and ankle.  Impression and Recommendations:   This case required medical decision making of moderate complexity.  Ganglion cyst of left foot Aspiration and injection. Advised about the 50% recurrence rate, she is post surgical excision 1. Strapped with compressive dressing. She may not need the operation, return to see me in one month to reevaluate. ___________________________________________ Gwen Her. Dianah Field, M.D., ABFM., CAQSM. Primary Care and [place] Instructor of [place] of [facility] of Medicine

## 2017-04-05 NOTE — Assessment & Plan Note (Signed)
Aspiration and injection. Advised about the 50% recurrence rate, she is post surgical excision 1. Strapped with compressive dressing. She may not need the operation, return to see me in one month to reevaluate.

## 2017-04-18 ENCOUNTER — Encounter: Payer: Self-pay | Admitting: Sports Medicine

## 2017-04-22 ENCOUNTER — Ambulatory Visit (INDEPENDENT_AMBULATORY_CARE_PROVIDER_SITE_OTHER): Payer: BC Managed Care – PPO | Admitting: Sports Medicine

## 2017-04-22 ENCOUNTER — Encounter: Payer: Self-pay | Admitting: Sports Medicine

## 2017-04-22 DIAGNOSIS — M67472 Ganglion, left ankle and foot: Secondary | ICD-10-CM

## 2017-04-22 DIAGNOSIS — M7918 Myalgia, other site: Secondary | ICD-10-CM

## 2017-04-22 NOTE — Assessment & Plan Note (Signed)
We will try a another course of physical therapy, at [facility] [place], preferably with Lattie Haw. Cervical spine MRI was for the most part normal.  Pain is mostly myofascial.

## 2017-04-22 NOTE — Assessment & Plan Note (Signed)
Aspiration and injection was successful, doing extremely well.

## 2017-04-22 NOTE — Progress Notes (Signed)
  Subjective:    CC: Follow-up  HPI: Ganglion cyst on the foot: Completely resolved after aspiration and injection, patient is well aware of the 50% recurrence rate.  Neck pain: Did well with physical therapy in the past, has a negative neck MRI, pain is mostly myofascial over the trapezius and rhomboids, would like to do physical therapy again with her established PT provider at [facility] [place].  Past medical history:  Negative.  See flowsheet/record as well for more information.  Surgical history: Negative.  See flowsheet/record as well for more information.  Family history: Negative.  See flowsheet/record as well for more information.  Social history: Negative.  See flowsheet/record as well for more information.  Allergies, and medications have been entered into the medical record, reviewed, and no changes needed.   Review of Systems: No fevers, chills, night sweats, weight loss, chest pain, or shortness of breath.   Objective:    General: Well Developed, well nourished, and in no acute distress.  Neuro: Alert and oriented x3, extra-ocular muscles intact, sensation grossly intact.  HEENT: Normocephalic, atraumatic, pupils equal round reactive to light, neck supple, no masses, no lymphadenopathy, thyroid nonpalpable.  Skin: Warm and dry, no rashes. Cardiac: Regular rate and rhythm, no murmurs rubs or gallops, no lower extremity edema.  Respiratory: Clear to auscultation bilaterally. Not using accessory muscles, speaking in full sentences. Left foot: No visible erythema or swelling. Range of motion is full in all directions. Strength is 5/5 in all directions. No hallux valgus. No pes cavus or pes planus. No abnormal callus noted. No pain over the navicular prominence, or base of fifth metatarsal. No tenderness to palpation of the calcaneal insertion of plantar fascia. No pain at the Achilles insertion. No pain over the calcaneal bursa. No pain of the retrocalcaneal  bursa. No tenderness to palpation over the tarsals, metatarsals, or phalanges. No hallux rigidus or limitus. No tenderness palpation over interphalangeal joints. No pain with compression of the metatarsal heads. Neurovascularly intact distally.  Impression and Recommendations:    Myofascial pain syndrome, cervical We will try a another course of physical therapy, [facility] [place], preferably with Lattie Haw. Cervical spine MRI was for the most part normal.  Pain is mostly myofascial.  Ganglion cyst of left foot Aspiration and injection was successful, doing extremely well.  ___________________________________________ Gwen Her. Dianah Field, M.D., ABFM., CAQSM. Primary Care and [place] Instructor of [place] of [facility] of Medicine

## 2017-04-24 ENCOUNTER — Ambulatory Visit: Payer: BC Managed Care – PPO | Admitting: Physician Assistant

## 2017-04-24 DIAGNOSIS — Z0189 Encounter for other specified special examinations: Secondary | ICD-10-CM

## 2017-04-28 ENCOUNTER — Encounter: Payer: Self-pay | Admitting: Physician Assistant

## 2017-05-03 ENCOUNTER — Ambulatory Visit: Payer: BC Managed Care – PPO | Admitting: Sports Medicine

## 2017-05-03 DIAGNOSIS — Z0189 Encounter for other specified special examinations: Secondary | ICD-10-CM

## 2017-05-10 ENCOUNTER — Encounter: Payer: Self-pay | Admitting: Physician Assistant

## 2017-05-12 ENCOUNTER — Encounter: Payer: Self-pay | Admitting: Physician Assistant

## 2017-05-13 ENCOUNTER — Other Ambulatory Visit: Payer: Self-pay | Admitting: Physician Assistant

## 2017-05-13 DIAGNOSIS — K047 Periapical abscess without sinus: Secondary | ICD-10-CM

## 2017-05-20 ENCOUNTER — Encounter: Payer: Self-pay | Admitting: Physician Assistant

## 2017-05-29 ENCOUNTER — Other Ambulatory Visit: Payer: Self-pay | Admitting: Physician Assistant

## 2017-06-01 ENCOUNTER — Encounter: Payer: Self-pay | Admitting: Physician Assistant

## 2017-06-05 ENCOUNTER — Ambulatory Visit (INDEPENDENT_AMBULATORY_CARE_PROVIDER_SITE_OTHER): Payer: BC Managed Care – PPO | Admitting: Physician Assistant

## 2017-06-05 ENCOUNTER — Encounter: Payer: Self-pay | Admitting: Physician Assistant

## 2017-06-05 VITALS — BP 102/66 | HR 92 | Wt 179.6 lb

## 2017-06-05 DIAGNOSIS — G4733 Obstructive sleep apnea (adult) (pediatric): Secondary | ICD-10-CM

## 2017-06-05 DIAGNOSIS — T698XXA Other specified effects of reduced temperature, initial encounter: Secondary | ICD-10-CM

## 2017-06-05 DIAGNOSIS — E6609 Other obesity due to excess calories: Secondary | ICD-10-CM | POA: Diagnosis not present

## 2017-06-05 DIAGNOSIS — Z6833 Body mass index (BMI) 33.0-33.9, adult: Secondary | ICD-10-CM

## 2017-06-05 DIAGNOSIS — R002 Palpitations: Secondary | ICD-10-CM | POA: Diagnosis not present

## 2017-06-05 DIAGNOSIS — Z1159 Encounter for screening for other viral diseases: Secondary | ICD-10-CM

## 2017-06-05 DIAGNOSIS — R635 Abnormal weight gain: Secondary | ICD-10-CM | POA: Diagnosis not present

## 2017-06-05 DIAGNOSIS — E039 Hypothyroidism, unspecified: Secondary | ICD-10-CM | POA: Diagnosis not present

## 2017-06-05 MED ORDER — CLOBETASOL PROPIONATE 0.05 % EX CREA: TOPICAL_CREAM | CUTANEOUS | 0 refills | Status: DC

## 2017-06-05 MED ORDER — NONFORMULARY OR COMPOUNDED ITEM: 0 refills | Status: DC

## 2017-06-05 NOTE — Progress Notes (Signed)
HPI:                                                                Sherry Romero is a 51 y.o. female who presents to [place] today for heart palpitations  This pleasant 51 yo F with history of OSA on CPAP, hypothyroidism, hx of breast cancer, and obesity reports for the last 2 weeks she has been waking up in the middle of the night feeling her heart racing. She denies chest pain, orthopnea, or PND. Reports she feels anxious during these episodes and turns her TV on to relax. Episodes last for a few minutes. She is currently tolerating aerobic exercise 3-5 days per week and denies any pre-syncope or syncope. Denies decreased exercise tolerance, DOE, cough or wheeze. She wears an Apple watch and has noticed fluctuations in her heart rate, which concerned her, but she denies irregular or skipped beats.  Family history is significant heart disease.  She also reports dry, cracked skin that occasionally bleeds on her hands bilaterally. There is a fissure on her right 5th finger that keeps re-opening despite moisturizing and wearing a band-aid.  Past Medical History:  Diagnosis Date  . Breast cancer ([place])   . Cancer ([place])   . Hyperlipidemia   . Obesity   . OSA (obstructive sleep apnea)   . Osteoporosis   . Thyroid disease    Past Surgical History:  Procedure Laterality Date  . ABDOMINAL HYSTERECTOMY     TAH - no cancer  . ABDOMINOPLASTY    . APPENDECTOMY    . BREAST RECONSTRUCTION    . CESAREAN SECTION     x 3  . MASTECTOMY Bilateral    Social History   Tobacco Use  . Smoking status: Never Smoker  . Smokeless tobacco: Never Used  Substance Use Topics  . Alcohol use: Yes    Alcohol/week: 1.2 oz    Types: 2 Glasses of wine per week   family history includes Heart disease in her maternal grandfather and paternal grandfather; Hyperlipidemia in her father and mother; Hypertension in her father, maternal grandfather, maternal  grandmother, mother, paternal grandfather, and paternal grandmother; Stroke in her mother.  ROS: negative except as noted in the HPI  Medications: Current Outpatient Medications  Medication Sig Dispense Refill  . atorvastatin (LIPITOR) 20 MG tablet TAKE 1 TABLET BY MOUTH EVERY DAY 90 tablet 0  . Biotin 800 MCG TABS Take 800 mcg by mouth.    . co-enzyme Q-10 30 MG capsule Take 100 mg by mouth daily.    . cyclobenzaprine (FLEXERIL) 10 MG tablet 1 tab by mouth up to 3 times per day 30 tablet 11  . denosumab (PROLIA) 60 MG/ML SOLN injection Inject 60 mg into the skin every 6 (six) months. Administer in upper arm, thigh, or abdomen    . ipratropium (ATROVENT) 0.06 % nasal spray Place 1 spray into both nostrils 4 (four) times daily as needed. 15 mL 0  . levothyroxine (SYNTHROID, LEVOTHROID) 50 MCG tablet TAKE 1 TABLET BY MOUTH EVERY DAY 90 tablet 0  . magnesium oxide (MAG-OX) 400 MG tablet Take 400 mg by mouth daily.    . Menthol, Topical Analgesic, (BIOFREEZE ROLL-ON COLORLESS) 4 % GEL Roll on topically to  the affected area twice a day to 3 times a day    . omeprazole (PRILOSEC) 20 MG capsule Take 1 capsule (20 mg total) by mouth daily. (Patient not taking: Reported on 04/05/2017) 30 capsule 5  . traZODone (DESYREL) 50 MG tablet Take 0.5-1 tablets (25-50 mg total) by mouth at bedtime as needed for sleep. 30 tablet 2   No current facility-administered medications for this visit.    Allergies  Allergen Reactions  . Pollen Extract Shortness Of Breath    [Other] asthma  . Bee Pollen Other (See Comments)    Allergic to bees  -Shock] anaphylaxis  . Latex Rash    [Rash] red rash (gloves, and tape products)  . Letrozole Other (See Comments)    Muscle spasms allergic to generic only  . Levothyroxine Nausea Only    Only occurs with generic, not name brand  . Pork Allergy Nausea Only    GI upset  . Tape Rash    [Rash] [Other] red/swollen       Objective:  BP 102/66   Pulse 92   Wt 179 lb  9.6 oz (81.5 kg)   BMI 33.94 kg/m  Gen:  alert, not ill-appearing, no distress, appropriate for age, obese female HEENT: head normocephalic without obvious abnormality, conjunctiva and cornea clear, trachea midline Pulm: Normal work of breathing, normal phonation, clear to auscultation bilaterally, no wheezes, rales or rhonchi CV: Normal rate, regular rhythm, s1 and s2 distinct, no murmurs, clicks or rubs  Neuro: alert and oriented x 3, no tremor MSK: extremities atraumatic, normal gait and station Skin: intact, fissure on PIP joint of right 5th phalanx; no cyanosis Psych: well-groomed, cooperative, good eye contact, euthymic mood, affect mood-congruent, speech is articulate, and thought processes clear and goal-directed  Depression screen [facility] 2/9 02/13/2017 12/17/2016 09/24/2016  Decreased Interest 0 1 0  Down, Depressed, Hopeless 0 2 0  PHQ - 2 Score 0 3 0  Altered sleeping - 2 -  Tired, decreased energy - 2 -  Change in appetite - 0 -  Feeling bad or failure about yourself  - 0 -  Trouble concentrating - 0 -  Moving slowly or fidgety/restless - 0 -  Suicidal thoughts - 0 -  PHQ-9 Score - 7 -     No results found for this or any previous visit (from the past 72 hour(s)). No results found.    Assessment and Plan: 51 y.o. female with   1. Palpitations - given her OSA, patient is at increased risk of dysrhythmia. Recommend further work-up with labs and 48-hr holter - avoid stimulants - we discussed that AppleWatch is not a reliable source to measure pulse and encouraged her to palpate a radial pulse when she is feeling symptomatic. - TSH - Holter monitor - 48 hour; Future - Basic metabolic panel - B Nat Peptide  2. Mild obstructive sleep apnea - complaint with CPAP  3. Acquired hypothyroidism - TSH  4. Encounter for hepatitis C screening test for low risk patient - Hepatitis C antibody  5. Abnormal weight gain   6. Class 1 obesity due to excess calories with serious  comorbidity and body mass index (BMI) of 33.0 to 33.9 in adult - patient recently purchased an exercise machine and is requesting a prescription to get insurance reimbursement - NONFORMULARY OR COMPOUNDED ITEM; ZAAZ WBV machine  Dispense: 1 each; Refill: 0  7. Chapped skin, initial encounter - clobetasol cream (TEMOVATE) 0.05 %; Apply topically twice daily to affected areas for  2 weeks  Dispense: 15 g; Refill: 0  Patient education and anticipatory guidance given Patient agrees with treatment plan Follow-up in 3 weeks or sooner as needed if symptoms worsen or fail to improve  Darlyne Russian PA-C

## 2017-06-05 NOTE — Patient Instructions (Signed)
Palpitations A palpitation is the feeling that your heartbeat is irregular or is faster than normal. It may feel like your heart is fluttering or skipping a beat. Palpitations are usually not a serious problem. They may be caused by many things, including smoking, caffeine, alcohol, stress, and certain medicines. Although most causes of palpitations are not serious, palpitations can be a sign of a serious medical problem. In some cases, you may need further medical evaluation. Follow these instructions at home: Pay attention to any changes in your symptoms. Take these actions to help with your condition:  Avoid the following: ? Caffeinated coffee, tea, soft drinks, diet pills, and energy drinks. ? Chocolate. ? Alcohol.  Do not use any tobacco products, such as cigarettes, chewing tobacco, and e-cigarettes. If you need help quitting, ask your health care provider.  Try to reduce your stress and anxiety. Things that can help you relax include: ? Yoga. ? Meditation. ? Physical activity, such as swimming, jogging, or walking. ? Biofeedback. This is a method that helps you learn to use your mind to control things in your body, such as your heartbeats.  Get plenty of rest and sleep.  Take over-the-counter and prescription medicines only as told by your health care provider.  Keep all follow-up visits as told by your health care provider. This is important.  Contact a health care provider if:  You continue to have a fast or irregular heartbeat after 24 hours.  Your palpitations occur more often. Get help right away if:  You have chest pain or shortness of breath.  You have a severe headache.  You feel dizzy or you faint. This information is not intended to replace advice given to you by your health care provider. Make sure you discuss any questions you have with your health care provider. Document Released: 06/08/2000 Document Revised: 11/14/2015 Document Reviewed: 02/24/2015 Elsevier  Interactive Patient Education  2017 [employer]vier Inc.  

## 2017-06-06 ENCOUNTER — Encounter: Payer: Self-pay | Admitting: Physician Assistant

## 2017-06-06 LAB — BASIC METABOLIC PANEL
BUN: 13 mg/dL (ref 7–25)
CALCIUM: 9.8 mg/dL (ref 8.6–10.4)
CHLORIDE: 101 mmol/L (ref 98–110)
CO2: 29 mmol/L (ref 20–32)
CREATININE: 0.79 mg/dL (ref 0.50–1.05)
Glucose, Bld: 98 mg/dL (ref 65–99)
POTASSIUM: 4.3 mmol/L (ref 3.5–5.3)
Sodium: 140 mmol/L (ref 135–146)

## 2017-06-06 LAB — HEPATITIS C ANTIBODY
Hepatitis C Ab: NONREACTIVE
SIGNAL TO CUT-OFF: 0.07 (ref <1.00)

## 2017-06-06 LAB — TSH: TSH: 1.22 m[IU]/L

## 2017-06-06 LAB — BRAIN NATRIURETIC PEPTIDE: BRAIN NATRIURETIC PEPTIDE: 11 pg/mL (ref <100)

## 2017-06-06 NOTE — Progress Notes (Signed)
Hi Eliza,  Your labs look great. Thyroid level is normal There is no electrolyte abnormality Your hepatitis c screening is negative  Plan does not change. Please contact our office if you are not contacted about scheduling your holter monitor within 5 business days.  Best, Evlyn Clines

## 2017-06-19 ENCOUNTER — Encounter: Payer: Self-pay | Admitting: Physician Assistant

## 2017-06-21 ENCOUNTER — Encounter: Payer: Self-pay | Admitting: Physician Assistant

## 2017-06-21 ENCOUNTER — Ambulatory Visit (INDEPENDENT_AMBULATORY_CARE_PROVIDER_SITE_OTHER): Payer: BC Managed Care – PPO | Admitting: Physician Assistant

## 2017-06-21 VITALS — BP 112/69 | HR 80 | Temp 98.1°F | Resp 16 | Wt 183.0 lb

## 2017-06-21 DIAGNOSIS — Z713 Dietary counseling and surveillance: Secondary | ICD-10-CM

## 2017-06-21 DIAGNOSIS — R635 Abnormal weight gain: Secondary | ICD-10-CM | POA: Diagnosis not present

## 2017-06-21 DIAGNOSIS — E786 Lipoprotein deficiency: Secondary | ICD-10-CM | POA: Diagnosis not present

## 2017-06-21 DIAGNOSIS — G4733 Obstructive sleep apnea (adult) (pediatric): Secondary | ICD-10-CM | POA: Diagnosis not present

## 2017-06-21 DIAGNOSIS — Z6834 Body mass index (BMI) 34.0-34.9, adult: Secondary | ICD-10-CM | POA: Diagnosis not present

## 2017-06-21 DIAGNOSIS — E6609 Other obesity due to excess calories: Secondary | ICD-10-CM

## 2017-06-21 MED ORDER — LIRAGLUTIDE -WEIGHT MANAGEMENT 18 MG/3ML ~~LOC~~ SOPN: 3.0000 mg | PEN_INJECTOR | Freq: Every day | SUBCUTANEOUS | 0 refills | Status: DC

## 2017-06-21 NOTE — Progress Notes (Signed)
HPI:                                                                Sherry Romero is a 51 y.o. female who presents to [facility] today for weight loss counseling  Pleasant 51 yo F with PMH of obesity, hypothyroidism, and OSA presents for weight management. Patient is interested in re-starting Qsymia for weight loss  Previous weight loss attempts Nutrion counseling October 2018, June 2018 Qsymia June 2017 for 2 weeks, stopped due to dry eye/dry mouth Contrave April-May 2018 fatigue and did not have adequate appetite suppression Weight management April-May 2016 [facility]  Obesity History Weight in late teens: 119 lb. Period of greatest weight gain: during mid adult years/pregnancy Highest adult weight: 203 Current weight: 183 Ib Amount of time at present weight: fluctuating 170's-180's x 2 years.  History of Weight Loss Efforts Successful weight loss techniques attempted: self-directed dieting Greatest amount of weight lost: 8 lb maintained over 2-3 months   Current Exercise Habits - 2-3 times per week Considers current exercise level - home treadmill 10-15 minutes, walking at 2.0-2.2  Current Eating Habits Number of regular meals per day: grazing ever 2-3 hours Number of snacking episodes per day: 2 Who shops for food? patient Who prepares food? patient  Binge behavior?: no Purge behavior? During teens and twenties Guilt feelings associated with eating? yes  Current Sleep Habits  4-6 hours of sleep on weeknights, poor sleep hygiene   Other Potential Contributing Factors Thyroid disease Use of alcohol: average 2 drinks/week Use of medications that may cause weight gain: none Psych History: good mood   Past Medical History:  Diagnosis Date  . Breast cancer ([place])   . Cancer ([place])   . Current mild episode of major depressive disorder without prior episode ([place]) 12/17/2016  . Hyperlipidemia   .  Obesity   . OSA (obstructive sleep apnea)   . Osteoporosis   . Otalgia of both ears 03/27/2017  . Thyroid disease    Past Surgical History:  Procedure Laterality Date  . ABDOMINAL HYSTERECTOMY     TAH - no cancer  . ABDOMINOPLASTY    . APPENDECTOMY    . BREAST RECONSTRUCTION    . CESAREAN SECTION     x 3  . MASTECTOMY Bilateral    Social History   Tobacco Use  . Smoking status: Never Smoker  . Smokeless tobacco: Never Used  Substance Use Topics  . Alcohol use: Yes    Alcohol/week: 1.2 oz    Types: 2 Glasses of wine per week   family history includes Heart disease in her maternal grandfather and paternal grandfather; Hyperlipidemia in her father and mother; Hypertension in her father, maternal grandfather, maternal grandmother, mother, paternal grandfather, and paternal grandmother; Stroke in her mother.  ROS: negative except as noted in the HPI  Medications: Current Outpatient Medications  Medication Sig Dispense Refill  . atorvastatin (LIPITOR) 20 MG tablet TAKE 1 TABLET BY MOUTH EVERY DAY 90 tablet 0  . Biotin 800 MCG TABS Take 800 mcg by mouth.    . clobetasol cream (TEMOVATE) 0.05 % Apply topically twice daily to affected areas for 2 weeks 15 g 0  . cyclobenzaprine (FLEXERIL) 10 MG tablet  1 tab by mouth up to 3 times per day 30 tablet 11  . denosumab (PROLIA) 60 MG/ML SOLN injection Inject 60 mg into the skin every 6 (six) months. Administer in upper arm, thigh, or abdomen    . levothyroxine (SYNTHROID, LEVOTHROID) 50 MCG tablet TAKE 1 TABLET BY MOUTH EVERY DAY 90 tablet 0  . magnesium oxide (MAG-OX) 400 MG tablet Take 400 mg by mouth daily.    . NONFORMULARY OR COMPOUNDED ITEM ZAAZ WBV machine 1 each 0  . Liraglutide -Weight Management (SAXENDA) 18 MG/3ML SOPN Inject 3 mg into the skin daily. 0.6 mg inj subcut daily for 1 week, then incr by 0.6 mg weekly until reaching 3 mg injected subcut daily 5 pen 0   No current facility-administered medications for this visit.     Allergies  Allergen Reactions  . Pollen Extract Shortness Of Breath    [Other] asthma  . Bee Pollen Other (See Comments)    Allergic to bees  -Shock] anaphylaxis  . Latex Rash    [Rash] red rash (gloves, and tape products)  . Letrozole Other (See Comments)    Muscle spasms allergic to generic only  . Levothyroxine Nausea Only    Only occurs with generic, not name brand  . Pork Allergy Nausea Only    GI upset  . Tape Rash    [Rash] [Other] red/swollen       Objective:  BP 112/69 (BP Location: Right Arm, Patient Position: Sitting, Cuff Size: Large)   Pulse 80   Temp 98.1 F (36.7 C) (Oral)   Resp 16   Wt 183 lb (83 kg)   SpO2 100%   BMI 34.58 kg/m  Gen:  alert, not ill-appearing, no distress, appropriate for age, obese female HEENT: head normocephalic without obvious abnormality, conjunctiva and cornea clear, wearing glasses, trachea midline Pulm: Normal work of breathing, normal phonation, clear to auscultation bilaterally, no wheezes, rales or rhonchi CV: Normal rate, regular rhythm, s1 and s2 distinct, no murmurs, clicks or rubs  Neuro: alert and oriented x 3, no tremor MSK: extremities atraumatic, normal gait and station Skin: intact, no rashes on exposed skin, no jaundice, no cyanosis Psych: well-groomed, cooperative, good eye contact, euthymic mood, affect mood-congruent, speech is articulate, and thought processes clear and goal-directed  Depression screen [facility] 2/9 06/21/2017 02/13/2017 12/17/2016 09/24/2016  Decreased Interest 0 0 1 0  Down, Depressed, Hopeless 0 0 2 0  PHQ - 2 Score 0 0 3 0  Altered sleeping - - 2 -  Tired, decreased energy - - 2 -  Change in appetite - - 0 -  Feeling bad or failure about yourself  - - 0 -  Trouble concentrating - - 0 -  Moving slowly or fidgety/restless - - 0 -  Suicidal thoughts - - 0 -  PHQ-9 Score - - 7 -     No results found for this or any previous visit (from the past 72 hour(s)). No results  found.    Assessment and Plan: 51 y.o. female with   1. Encounter for weight loss counseling   2. Class 1 obesity due to excess calories with serious comorbidity and body mass index (BMI) of 34.0 to 34.9 in adult - intolerance/adverse effects with Contrave and Qsymia. Starting Saxenda - counseled on weight loss through decreasing calories to 1100-1200 and increasing aerobic exercise to at least 120 minutes per week - she plans to follow-up with nutritionist next month. Counseled on choosing low glycemic foods. Okay to  graze as long as she is choose nutrient dense foods and measuring calories - Liraglutide -Weight Management (SAXENDA) 18 MG/3ML SOPN; Inject 3 mg into the skin daily. 0.6 mg inj subcut daily for 1 week, then incr by 0.6 mg weekly until reaching 3 mg injected subcut daily  Dispense: 5 pen; Refill: 0  3. Abnormal weight gain Wt Readings from Last 3 Encounters:  06/21/17 183 lb (83 kg)  06/05/17 179 lb 9.6 oz (81.5 kg)  04/22/17 181 lb (82.1 kg)  - Liraglutide -Weight Management (SAXENDA) 18 MG/3ML SOPN; Inject 3 mg into the skin daily. 0.6 mg inj subcut daily for 1 week, then incr by 0.6 mg weekly until reaching 3 mg injected subcut daily  Dispense: 5 pen; Refill: 0  4. Mild obstructive sleep apnea - referral to [facility] for CPAP titration - weight loss as above  5. Low HDL - continue to follow-up with nutritionist to choose foods low in saturated fat and high in healthy fats    Patient education and anticipatory guidance given Patient agrees with treatment plan Follow-up in 4 weeks for weight management or sooner as needed if symptoms worsen or fail to improve  Darlyne Russian PA-C

## 2017-06-21 NOTE — Patient Instructions (Signed)
-   okay to graze every 3-4 hours - choose low glycemic foods - limit calories to 1100-1200 per days - continue doing the treadmill: walk on incline, try to get up to speed 4, for 20 minutes, 4 times per week

## 2017-06-24 ENCOUNTER — Telehealth: Payer: Self-pay | Admitting: *Deleted

## 2017-06-24 NOTE — Telephone Encounter (Signed)
PA done over the phone for saxenda. PA # is [phone]. Pharmacy has been notified as well as patient

## 2017-06-26 ENCOUNTER — Encounter: Payer: Self-pay | Admitting: Physician Assistant

## 2017-06-26 DIAGNOSIS — F99 Mental disorder, not otherwise specified: Principal | ICD-10-CM

## 2017-06-26 DIAGNOSIS — F418 Other specified anxiety disorders: Secondary | ICD-10-CM

## 2017-06-26 DIAGNOSIS — F5105 Insomnia due to other mental disorder: Secondary | ICD-10-CM

## 2017-06-28 ENCOUNTER — Encounter: Payer: Self-pay | Admitting: Physician Assistant

## 2017-06-28 ENCOUNTER — Ambulatory Visit: Payer: BC Managed Care – PPO | Admitting: Physician Assistant

## 2017-06-28 DIAGNOSIS — Z0189 Encounter for other specified special examinations: Secondary | ICD-10-CM

## 2017-07-05 DIAGNOSIS — H6242 Otitis externa in other diseases classified elsewhere, left ear: Secondary | ICD-10-CM

## 2017-07-05 DIAGNOSIS — B369 Superficial mycosis, unspecified: Secondary | ICD-10-CM | POA: Insufficient documentation

## 2017-07-10 ENCOUNTER — Ambulatory Visit (INDEPENDENT_AMBULATORY_CARE_PROVIDER_SITE_OTHER): Payer: BC Managed Care – PPO

## 2017-07-10 ENCOUNTER — Encounter: Payer: Self-pay | Admitting: *Deleted

## 2017-07-10 DIAGNOSIS — R002 Palpitations: Secondary | ICD-10-CM | POA: Diagnosis not present

## 2017-07-18 ENCOUNTER — Encounter: Payer: Self-pay | Admitting: Physician Assistant

## 2017-07-18 DIAGNOSIS — R002 Palpitations: Secondary | ICD-10-CM

## 2017-07-18 HISTORY — DX: Palpitations: R00.2

## 2017-07-18 NOTE — Progress Notes (Signed)
[place],  Your heart monitor results were very reassuring. There is no evidence of an abnormal heart rhythm.  Best, Evlyn Clines

## 2017-07-19 ENCOUNTER — Ambulatory Visit: Payer: BC Managed Care – PPO

## 2017-07-23 ENCOUNTER — Encounter: Payer: Self-pay | Admitting: Physician Assistant

## 2017-07-24 ENCOUNTER — Encounter: Payer: Self-pay | Admitting: Physician Assistant

## 2017-08-09 ENCOUNTER — Other Ambulatory Visit: Payer: Self-pay | Admitting: Physician Assistant

## 2017-08-13 ENCOUNTER — Institutional Professional Consult (permissible substitution): Payer: Self-pay | Admitting: Neurology

## 2017-08-15 ENCOUNTER — Encounter: Payer: Self-pay | Admitting: Neurology

## 2017-08-15 ENCOUNTER — Ambulatory Visit: Payer: BC Managed Care – PPO | Admitting: Neurology

## 2017-08-15 VITALS — BP 110/80 | HR 80 | Ht 58.5 in | Wt 183.0 lb

## 2017-08-15 DIAGNOSIS — Z9989 Dependence on other enabling machines and devices: Secondary | ICD-10-CM

## 2017-08-15 DIAGNOSIS — G4733 Obstructive sleep apnea (adult) (pediatric): Secondary | ICD-10-CM | POA: Diagnosis not present

## 2017-08-15 NOTE — Progress Notes (Signed)
Subjective:    Patient ID: Sherry Romero is a 52 y.o. female.  HPI     Sherry Age, MD, PhD [facility] [address] [place], [place] [address]  Benjamine Mola,  I saw your patient, Sherry Romero, upon your kind request in my neurologic clinic today for initial consultation of her sleep disorder, in particular, reevaluation of her prior diagnosis of obstructive sleep apnea, for second opinion. The patient is unaccompanied today. As you know, Sherry Romero is a 52 year old right-handed woman with an underlying medical history of osteoporosis, hyperlipidemia, breast cancer, heart disease and obesity, who was previously diagnosed with obstructive sleep apnea and has been on AutoPap therapy. She had sleep study testing at [facility]. She had a baseline sleep study on 04/26/2014 which I reviewed: Sleep efficiency was 89.8%. She had a delayed REM sleep of 335 minutes. REM sleep was 10.9% which is reduced. Total AHI was 5.5 per hour, REM AHI was 4.6 per hour, supine AHI was 8.6 per hour. Average oxygen saturation was 96%, nadir was 84%. She had no significant PLMS. She had a subsequent CPAP titration study, during which CPAP of 6 cm for as effective. I reviewed her current AutoPap compliance data from 06/19/2017 to 07/18/2017, which is a total of 30 days, during which time she used her machine 19 days with percent used days greater than 4 hours at 57%, indicating suboptimal compliance, residual AHI of 0.8 per hour, 95th percentile of pressure at 10.2, range of 6-12 cm, leak acceptable. She has not had recent appliance. She has essentially stopped using her AutoPap since January because of that reason. Her DME company [facility]. I reviewed your office note from 06/21/2017. Her Epworth sleepiness score is 10 out of 24, fatigue score is 17 out of 63. She lives with her son. She is a nonsmoker and drinks alcohol occasionally, caffeine, 2  servings per day on average. She works for the school system. She has been using the AutoPap regularly and feels improved since starting AutoPap therapy. She has no issues using a machine but has had trouble with her mask being uncomfortable. She has to constantly fix the position of her nasal cushion. She uses a [employer] fx for Her.  She is unhappy with her current DME company I would like to switch. She has been followed by a nurse practitioner at [facility] for her sleep disorder and sees her yearly, appointment coming up in April for her checkup. She has no other sleep related complaints. She has seen ENT recently and was told she had a deviated septum. She goes to bed around 10 and rides time is around 5. She has a total of 3 children, lives with her youngest son. She has a family history of obstructive sleep apnea in her brother who had a CPAP machine. He passed away at [place] 1 in a car accident. Her father has obstructive sleep apnea and has a CPAP machine.  Her Past Medical History Is Significant For: Past Medical History:  Diagnosis Date  . Breast cancer ([place])   . Cancer ([place])   . Current mild episode of major depressive disorder without prior episode ([place]) 12/17/2016  . Elevated serum creatinine 08/22/2016  . Heart palpitations 07/18/2017   07/18/2017 48-hr holter normal, rare PVC/PAC  . Hyperlipidemia   . Obesity   . OSA (obstructive sleep apnea)   . Osteoporosis   . Otalgia of both ears 03/27/2017  . Thyroid disease  Her Past Surgical History Is Significant For: Past Surgical History:  Procedure Laterality Date  . ABDOMINAL HYSTERECTOMY     TAH - no cancer  . ABDOMINOPLASTY    . APPENDECTOMY    . BREAST RECONSTRUCTION    . CESAREAN SECTION     x 3  . MASTECTOMY Bilateral     Her Family History Is Significant For: Family History  Problem Relation Romero of Onset  . Stroke Mother   . Hyperlipidemia Mother   . Hypertension Mother   . Hyperlipidemia Father   . Hypertension  Father   . Hypertension Maternal Grandmother   . Heart disease Maternal Grandfather   . Hypertension Maternal Grandfather   . Hypertension Paternal Grandmother   . Heart disease Paternal Grandfather   . Hypertension Paternal Grandfather     Her Social History Is Significant For: Social History   Socioeconomic History  . Marital status: Single    Spouse name: None  . Number of children: None  . Years of education: None  . Highest education level: None  Social Needs  . Financial resource strain: None  . Food insecurity - worry: None  . Food insecurity - inability: None  . Transportation needs - medical: None  . Transportation needs - non-medical: None  Occupational History  . None  Tobacco Use  . Smoking status: Never Smoker  . Smokeless tobacco: Never Used  Substance and Sexual Activity  . Alcohol use: Yes    Alcohol/week: 1.2 oz    Types: 2 Glasses of wine per week  . Drug use: No  . Sexual activity: Not Currently    Birth control/protection: None  Other Topics Concern  . None  Social History Narrative  . None    Her Allergies Are:  Allergies  Allergen Reactions  . Pollen Extract Shortness Of Breath    [Other] asthma  . Bee Pollen Other (See Comments)    Allergic to bees  -Shock] anaphylaxis  . Latex Rash    [Rash] red rash (gloves, and tape products)  . Letrozole Other (See Comments)    Muscle spasms allergic to generic only  . Levothyroxine Nausea Only    Only occurs with generic, not name brand  . Phentermine-Topiramate Palpitations    palpitations palpitations  . Pork Allergy Nausea Only    GI upset  . Tape Rash    [Rash] [Other] red/swollen  :   Her Current Medications Are:  Outpatient Encounter Medications as of 08/15/2017  Medication Sig  . atorvastatin (LIPITOR) 20 MG tablet TAKE 1 TABLET BY MOUTH EVERY DAY  . Biotin 800 MCG TABS Take 800 mcg by mouth.  . clobetasol cream (TEMOVATE) 0.05 % Apply topically twice daily to affected areas for 2  weeks  . cyclobenzaprine (FLEXERIL) 10 MG tablet 1 tab by mouth up to 3 times per day  . denosumab (PROLIA) 60 MG/ML SOLN injection Inject 60 mg into the skin every 6 (six) months. Administer in upper arm, thigh, or abdomen  . Liraglutide -Weight Management (SAXENDA) 18 MG/3ML SOPN Inject 3 mg into the skin daily. 0.6 mg inj subcut daily for 1 week, then incr by 0.6 mg weekly until reaching 3 mg injected subcut daily  . magnesium oxide (MAG-OX) 400 MG tablet Take 400 mg by mouth daily.  . NONFORMULARY OR COMPOUNDED ITEM ZAAZ WBV machine  . SYNTHROID 50 MCG tablet TAKE 1 TABLET BY MOUTH EVERY DAY   No facility-administered encounter medications on file as of 08/15/2017.   :  Review of Systems:  Out of a complete 14 point review of systems, all are reviewed and negative with the exception of these symptoms as listed below: Review of Systems  Neurological:       Pt presents today to discuss her cpap. Pt wants a second opinion to find out if her cpap is set at the correct pressure. Pt is using [facility] but would like to switch DMEs. Pt's current cpap is about 54 years old.  Epworth Sleepiness Scale 0= would never doze 1= slight chance of dozing 2= moderate chance of dozing 3= high chance of dozing  Sitting and reading: 3 Watching TV: 1 Sitting inactive in a public place (ex. Theater or meeting): 2 As a passenger in a car for an hour without a break: 0 Lying down to rest in the afternoon: 0 Sitting and talking to someone: 1 Sitting quietly after lunch (no alcohol): 1 In a car, while stopped in traffic: 2 Total: 10     Objective:  Neurological Exam  Physical Exam Physical Examination:   Vitals:   08/15/17 1603  BP: 110/80  Pulse: 80   General Examination: The patient is a very pleasant 52 y.o. female in no acute distress. She appears well-developed and well-nourished and well groomed.   HEENT: Normocephalic, atraumatic, pupils are equal, round and reactive to light and  accommodation. Extraocular tracking is good without limitation to gaze excursion or nystagmus noted. Normal smooth pursuit is noted. Hearing is grossly intact. Face is symmetric with normal facial animation and normal facial sensation. Speech is clear with no dysarthria noted. There is no hypophonia. There is no lip, neck/head, jaw or voice tremor. Oropharynx exam reveals: mild mouth dryness, adequate dental hygiene and mild airway crowding, due to smaller airway and wider uvula. Mallampati is class II. Tongue protrudes centrally and palate elevates symmetrically. Tonsils are small. Neck size is 14 5/8 inches.   Chest: Clear to auscultation without wheezing, rhonchi or crackles noted.  Heart: S1+S2+0, regular and normal without murmurs, rubs or gallops noted.   Skin: Warm and dry without trophic changes noted.  Musculoskeletal: exam reveals no obvious joint deformities, tenderness or joint swelling or erythema.   Neurologically:  Mental status: The patient is awake, alert and oriented in all 4 spheres. Her immediate and remote memory, attention, language skills and fund of knowledge are appropriate. There is no evidence of aphasia, agnosia, apraxia or anomia. Speech is clear with normal prosody and enunciation. Thought process is linear. Mood is normal and affect is normal.  Cranial nerves II - XII are as described above under HEENT exam. In addition: shoulder shrug is normal with equal shoulder height noted. Motor exam: Normal bulk, strength and tone is noted. There is no drift, tremor. Fine motor skills and coordination: grossly intact.  Cerebellar testing: No dysmetria or intention tremor on finger to nose testing. Heel to shin is unremarkable bilaterally. There is no truncal or gait ataxia.  Sensory exam: intact to light touch in the upper and lower extremities.  Gait, station and balance: She stands easily. No veering to one side is noted. No leaning to one side is noted. Posture is  Romero-appropriate and stance is narrow based. Gait shows normal stride length and normal pace. No problems turning are noted.    Assessment and Plan:   In summary, Sherry Romero is a very pleasant 52 y.o.-year old female with an underlying medical history of osteoporosis, hyperlipidemia, breast cancer, heart disease and obesity, who was diagnosed with overall  mild obstructive sleep apnea in 2015. She has been on AutoPap therapy with good compliance generally speaking but has had some problems with her interface. She has discomfort with her current mouth and would like to switch to another DME provider. She is encouraged to continue with her current sleep provider at [facility] which is much closer to her home. She usually just sees her once a year. She is encouraged to get in touch with her nausea practitioner sleep provider and advised to request a switch for another DME company so she can be fitted with a different mouse can continue using her AutoPap. Her current AutoPap seems to work well for her. She indicates good results with it and compliant with treatment since she got the machine. I do not think she would benefit from another sleep study. I suggested as needed follow-up with me. She has an appointment pending with her sleep provider at [facility] in April which I encouraged her to keep. I answered all her questions today and she was in agreement.  Thank you very much for allowing me to participate in the care of this nice patient. If I can be of any further assistance to you please do not hesitate to call me at [phone].  Sincerely,   Sherry Age, MD, PhD

## 2017-08-15 NOTE — Patient Instructions (Signed)
You have a history of mild obstructive sleep apnea. You have felt better with AutoPap therapy and you have been compliant with treatment. I would recommend, since you don't have a good experience with your current DME provider that you get in touch with your sleep provider, Helene Kelp Day, Nurse Practitioner, and request for her to send an order for your CPAP supplies to another DME company in your area. There are other options for you. I would recommend you keep your yearly appointment with your sleep nurse practitioner coming up in April. I would not recommend that you have another sleep study at this time, your machine seems to work fine.

## 2017-08-21 ENCOUNTER — Encounter: Payer: Self-pay | Admitting: Physician Assistant

## 2017-08-21 ENCOUNTER — Ambulatory Visit (INDEPENDENT_AMBULATORY_CARE_PROVIDER_SITE_OTHER): Payer: BC Managed Care – PPO | Admitting: Physician Assistant

## 2017-08-21 VITALS — BP 120/74 | HR 89 | Temp 98.2°F | Wt 183.0 lb

## 2017-08-21 DIAGNOSIS — J069 Acute upper respiratory infection, unspecified: Secondary | ICD-10-CM | POA: Diagnosis not present

## 2017-08-21 DIAGNOSIS — Z20828 Contact with and (suspected) exposure to other viral communicable diseases: Secondary | ICD-10-CM | POA: Diagnosis not present

## 2017-08-21 MED ORDER — IPRATROPIUM BROMIDE 0.06 % NA SOLN: 2.0000 | Freq: Four times a day (QID) | NASAL | 0 refills | Status: DC | PRN

## 2017-08-21 MED ORDER — DM-PHENYLEPHRINE-ACETAMINOPHEN 20-10-500 MG PO PACK: 1.0000 | PACK | Freq: Three times a day (TID) | ORAL | Status: DC | PRN

## 2017-08-21 NOTE — Progress Notes (Signed)
HPI:                                                                Sherry Romero is a 52 y.o. female who presents to [facility] [place] [facility] today for congestion / exposure to influenza  Sinusitis  This is a new problem. The current episode started in the past 7 days. The problem is unchanged. There has been no fever. Associated symptoms include chills, congestion, headaches and sinus pressure. Pertinent negatives include no coughing, ear pain, neck pain or shortness of breath. Treatments tried: NSAIDs. The treatment provided no relief.    Depression screen [facility] 2/9 06/21/2017 02/13/2017 12/17/2016 09/24/2016  Decreased Interest 0 0 1 0  Down, Depressed, Hopeless 0 0 2 0  PHQ - 2 Score 0 0 3 0  Altered sleeping - - 2 -  Tired, decreased energy - - 2 -  Change in appetite - - 0 -  Feeling bad or failure about yourself  - - 0 -  Trouble concentrating - - 0 -  Moving slowly or fidgety/restless - - 0 -  Suicidal thoughts - - 0 -  PHQ-9 Score - - 7 -    GAD 7 : Generalized Anxiety Score 12/17/2016  Nervous, Anxious, on Edge 2  Control/stop worrying 1  Worry too much - different things 3  Trouble relaxing 1  Restless 0  Easily annoyed or irritable 1  Afraid - awful might happen 2  Total GAD 7 Score 10      Past Medical History:  Diagnosis Date  . Breast cancer ([place])   . Cancer ([place])   . Current mild episode of major depressive disorder without prior episode ([place]) 12/17/2016  . Elevated serum creatinine 08/22/2016  . Heart palpitations 07/18/2017   07/18/2017 48-hr holter normal, rare PVC/PAC  . Hyperlipidemia   . Obesity   . OSA (obstructive sleep apnea)   . Osteoporosis   . Otalgia of both ears 03/27/2017  . Thyroid disease    Past Surgical History:  Procedure Laterality Date  . ABDOMINAL HYSTERECTOMY     TAH - no cancer  . ABDOMINOPLASTY    . APPENDECTOMY    . BREAST RECONSTRUCTION    . CESAREAN SECTION     x 3  . MASTECTOMY  Bilateral    Social History   Tobacco Use  . Smoking status: Never Smoker  . Smokeless tobacco: Never Used  Substance Use Topics  . Alcohol use: Yes    Alcohol/week: 1.2 oz    Types: 2 Glasses of wine per week   family history includes Heart disease in her maternal grandfather and paternal grandfather; Hyperlipidemia in her father and mother; Hypertension in her father, maternal grandfather, maternal grandmother, mother, paternal grandfather, and paternal grandmother; Stroke in her mother.    ROS: negative except as noted in the HPI  Medications: Current Outpatient Medications  Medication Sig Dispense Refill  . atorvastatin (LIPITOR) 20 MG tablet TAKE 1 TABLET BY MOUTH EVERY DAY 90 tablet 0  . Biotin 800 MCG TABS Take 800 mcg by mouth.    . clobetasol cream (TEMOVATE) 0.05 % Apply topically twice daily to affected areas for 2 weeks 15 g 0  . cyclobenzaprine (FLEXERIL) 10 MG tablet 1  tab by mouth up to 3 times per day 30 tablet 11  . denosumab (PROLIA) 60 MG/ML SOLN injection Inject 60 mg into the skin every 6 (six) months. Administer in upper arm, thigh, or abdomen    . DM-Phenylephrine-Acetaminophen (THERAFLU SEVERE COLD) 20-10-500 MG PACK Take 1 tablet by mouth every 8 (eight) hours as needed. 56 each   . ipratropium (ATROVENT) 0.06 % nasal spray Place 2 sprays into both nostrils 4 (four) times daily as needed. 15 mL 0  . Liraglutide -Weight Management (SAXENDA) 18 MG/3ML SOPN Inject 3 mg into the skin daily. 0.6 mg inj subcut daily for 1 week, then incr by 0.6 mg weekly until reaching 3 mg injected subcut daily 5 pen 0  . magnesium oxide (MAG-OX) 400 MG tablet Take 400 mg by mouth daily.    . NONFORMULARY OR COMPOUNDED ITEM ZAAZ WBV machine 1 each 0  . SYNTHROID 50 MCG tablet TAKE 1 TABLET BY MOUTH EVERY DAY 90 tablet 1   No current facility-administered medications for this visit.    Allergies  Allergen Reactions  . Pollen Extract Shortness Of Breath    [Other] asthma  .  Bee Pollen Other (See Comments)    Allergic to bees  -Shock] anaphylaxis  . Latex Rash    [Rash] red rash (gloves, and tape products)  . Letrozole Other (See Comments)    Muscle spasms allergic to generic only  . Levothyroxine Nausea Only    Only occurs with generic, not name brand  . Phentermine-Topiramate Palpitations    palpitations palpitations  . Pork Allergy Nausea Only    GI upset  . Tape Rash    [Rash] [Other] red/swollen       Objective:  BP 120/74   Pulse 89   Temp 98.2 F (36.8 C) (Oral)   Wt 183 lb (83 kg)   BMI 37.60 kg/m  Gen:  alert, ill-appearing, no distress, appropriate for age 23: head normocephalic without obvious abnormality, conjunctiva and cornea clear, wearing glasses, nasal mucosa edematous, left nare completely congested, right TM retracted, left TM clear, oropharynx clear, moist mucous membranes, neck supple, no adenopathy, trachea midline Pulm: Normal work of breathing, normal phonation, clear to auscultation bilaterally, no wheezes, rales or rhonchi CV: Normal rate, regular rhythm, s1 and s2 distinct, no murmurs, clicks or rubs  Neuro: alert and oriented x 3, no tremor MSK: extremities atraumatic, normal gait and station Skin: intact, no rashes on exposed skin, no jaundice, no cyanosis    No results found for this or any previous visit (from the past 72 hour(s)). No results found.    Assessment and Plan: 52 y.o. female with   1. Acute upper respiratory infection - she is outside of the window for Tamiflu. Symptomatic care. Work note provided through Monday. - ipratropium (ATROVENT) 0.06 % nasal spray; Place 2 sprays into both nostrils 4 (four) times daily as needed.  Dispense: 15 mL; Refill: 0 - DM-Phenylephrine-Acetaminophen (THERAFLU SEVERE COLD) 20-10-500 MG PACK; Take 1 tablet by mouth every 8 (eight) hours as needed.  Dispense: 79 each  Patient education and anticipatory guidance given Patient agrees with treatment  plan Follow-up as needed if symptoms worsen or fail to improve  Darlyne Russian PA-C

## 2017-08-21 NOTE — Patient Instructions (Signed)
- hydrate: drink at least 8-10 glasses of water per day - rest - avoid contact with others, especially infants and the elderly - Theraflu (follow instructions on package) - Atrovent nasal spray    Influenza, Adult Influenza, more commonly known as "the flu," is a viral infection that primarily affects the respiratory tract. The respiratory tract includes organs that help you breathe, such as the lungs, nose, and throat. The flu causes many common cold symptoms, as well as a high fever and body aches. The flu spreads easily from person to person (is contagious). Getting a flu shot (influenza vaccination) every year is the best way to prevent influenza. What are the causes? Influenza is caused by a virus. You can catch the virus by:  Breathing in droplets from an infected person's cough or sneeze.  Touching something that was recently contaminated with the virus and then touching your mouth, nose, or eyes.  What increases the risk? The following factors may make you more likely to get the flu:  Not cleaning your hands frequently with soap and water or alcohol-based hand sanitizer.  Having close contact with many people during cold and flu season.  Touching your mouth, eyes, or nose without washing or sanitizing your hands first.  Not drinking enough fluids or not eating a healthy diet.  Not getting enough sleep or exercise.  Being under a high amount of stress.  Not getting a yearly (annual) flu shot.  You may be at a higher risk of complications from the flu, such as a severe lung infection (pneumonia), if you:  Are over the age of 49.  Are pregnant.  Have a weakened disease-fighting system (immune system). You may have a weakened immune system if you: ? Have HIV or AIDS. ? Are undergoing chemotherapy. ? Aretaking medicines that reduce the activity of (suppress) the immune system.  Have a long-term (chronic) illness, such as heart disease, kidney disease, diabetes, or  lung disease.  Have a liver disorder.  Are obese.  Have anemia.  What are the signs or symptoms? Symptoms of this condition typically last 4-10 days and may include:  Fever.  Chills.  Headache, body aches, or muscle aches.  Sore throat.  Cough.  Runny or congested nose.  Chest discomfort and cough.  Poor appetite.  Weakness or tiredness (fatigue).  Dizziness.  Nausea or vomiting.  How is this diagnosed? This condition may be diagnosed based on your medical history and a physical exam. Your health care provider may do a nose or throat swab test to confirm the diagnosis. How is this treated? If influenza is detected early, you can be treated with antiviral medicine that can reduce the length of your illness and the severity of your symptoms. This medicine may be given by mouth (orally) or through an IV tube that is inserted in one of your veins. The goal of treatment is to relieve symptoms by taking care of yourself at home. This may include taking over-the-counter medicines, drinking plenty of fluids, and adding humidity to the air in your home. In some cases, influenza goes away on its own. Severe influenza or complications from influenza may be treated in a hospital. Follow these instructions at home:  Take over-the-counter and prescription medicines only as told by your health care provider.  Use a cool mist humidifier to add humidity to the air in your home. This can make breathing easier.  Rest as needed.  Drink enough fluid to keep your urine clear or pale yellow.  Cover your mouth and nose when you cough or sneeze.  Wash your hands with soap and water often, especially after you cough or sneeze. If soap and water are not available, use hand sanitizer.  Stay home from work or school as told by your health care provider. Unless you are visiting your health care provider, try to avoid leaving home until your fever has been gone for 24 hours without the use of  medicine.  Keep all follow-up visits as told by your health care provider. This is important. How is this prevented?  Getting an annual flu shot is the best way to avoid getting the flu. You may get the flu shot in late summer, fall, or winter. Ask your health care provider when you should get your flu shot.  Wash your hands often or use hand sanitizer often.  Avoid contact with people who are sick during cold and flu season.  Eat a healthy diet, drink plenty of fluids, get enough sleep, and exercise regularly. Contact a health care provider if:  You develop new symptoms.  You have: ? Chest pain. ? Diarrhea. ? A fever.  Your cough gets worse.  You produce more mucus.  You feel nauseous or you vomit. Get help right away if:  You develop shortness of breath or difficulty breathing.  Your skin or nails turn a bluish color.  You have severe pain or stiffness in your neck.  You develop a sudden headache or sudden pain in your face or ear.  You cannot stop vomiting. This information is not intended to replace advice given to you by your health care provider. Make sure you discuss any questions you have with your health care provider. Document Released: 06/08/2000 Document Revised: 11/17/2015 Document Reviewed: 04/05/2015 Elsevier Interactive Patient Education  2017 [employer].

## 2017-08-30 ENCOUNTER — Ambulatory Visit: Payer: BC Managed Care – PPO | Admitting: Physician Assistant

## 2017-08-30 DIAGNOSIS — Z0189 Encounter for other specified special examinations: Secondary | ICD-10-CM

## 2017-09-01 IMAGING — US US THYROID
1 series · 14 of 25 positions shown · non-contrast
Comparison: None.

CLINICAL DATA: Goiter.  Thyromegaly.

EXAM:
THYROID ULTRASOUND
TECHNIQUE: Ultrasound examination of the thyroid gland and adjacent soft
tissues was performed.

[Series 1: us thyroid · 0.06mm/px · 14 of 36 slices shown]
[im 1/36]
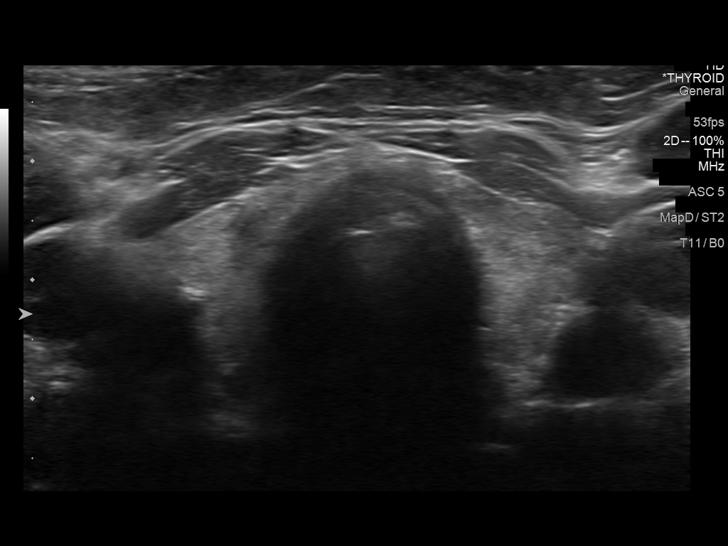
[im 3/36]
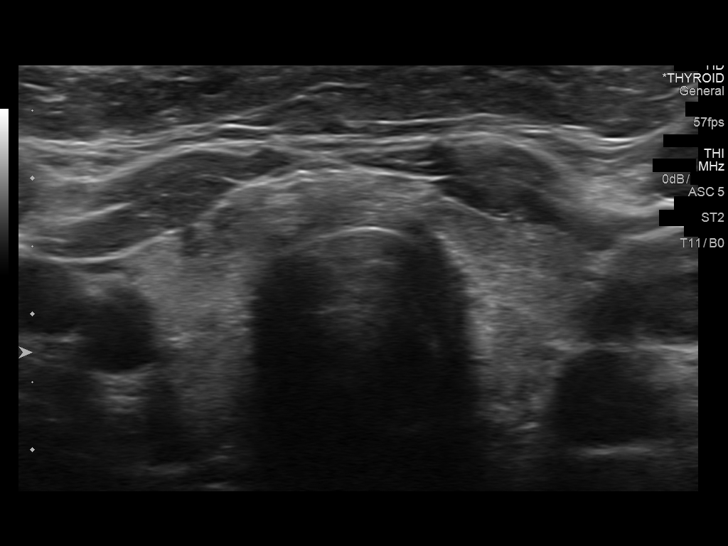
[im 6/36]
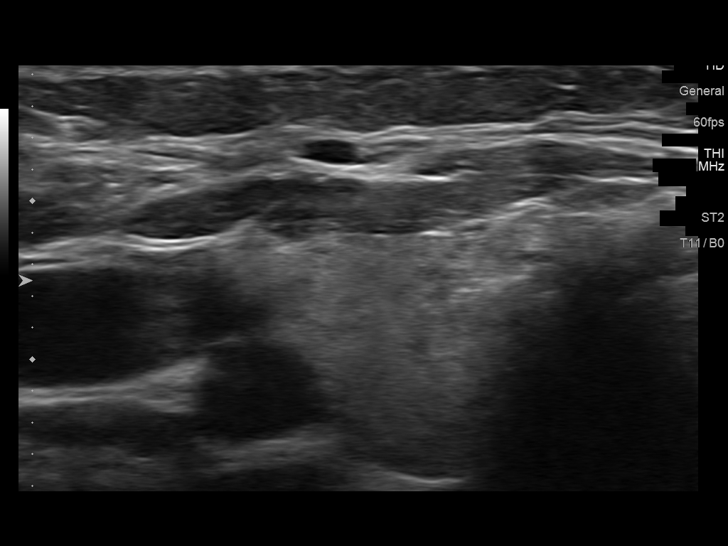
[im 9/36]
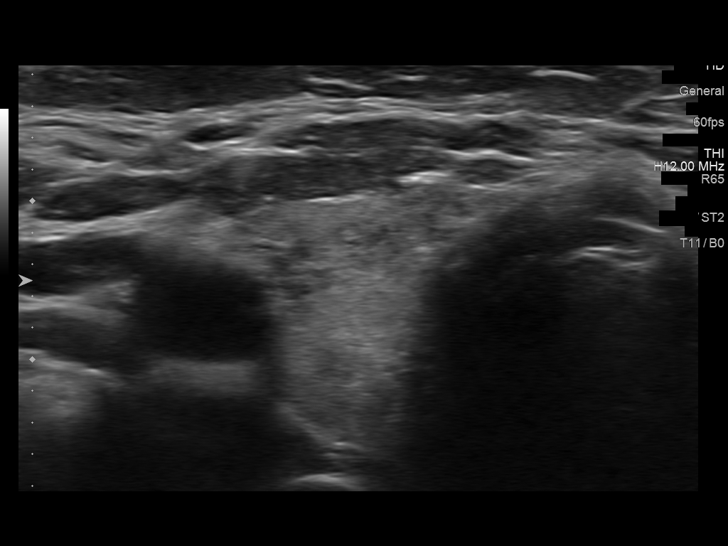
[im 12/36]
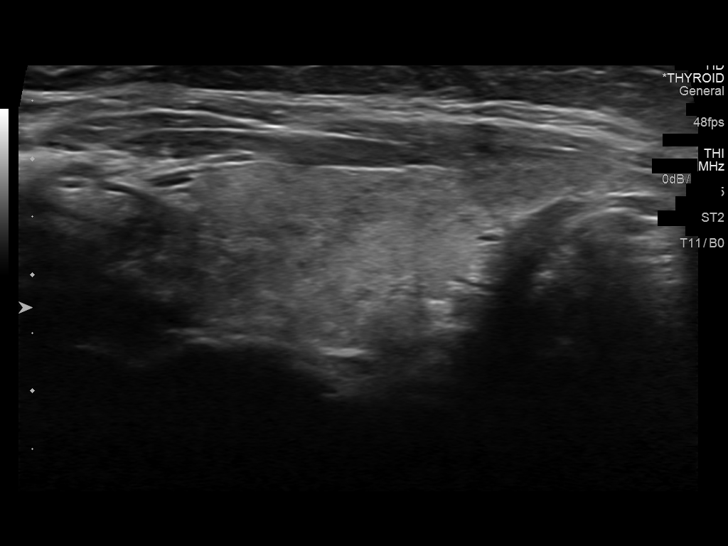
[im 14/36]
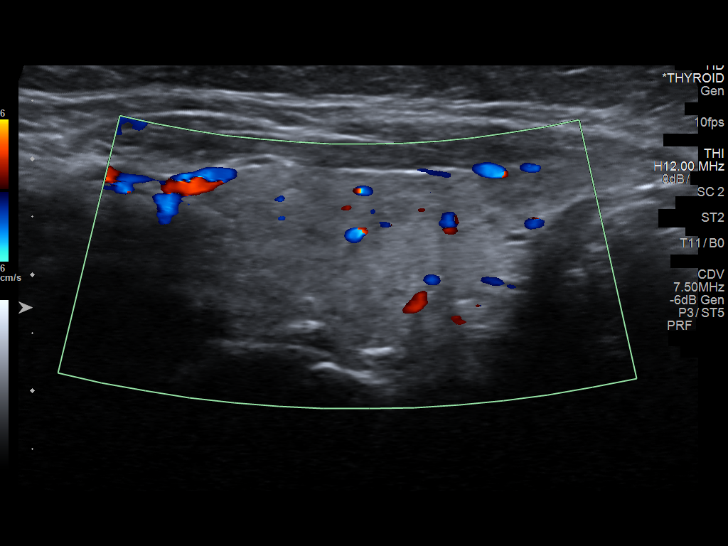
[im 17/36]
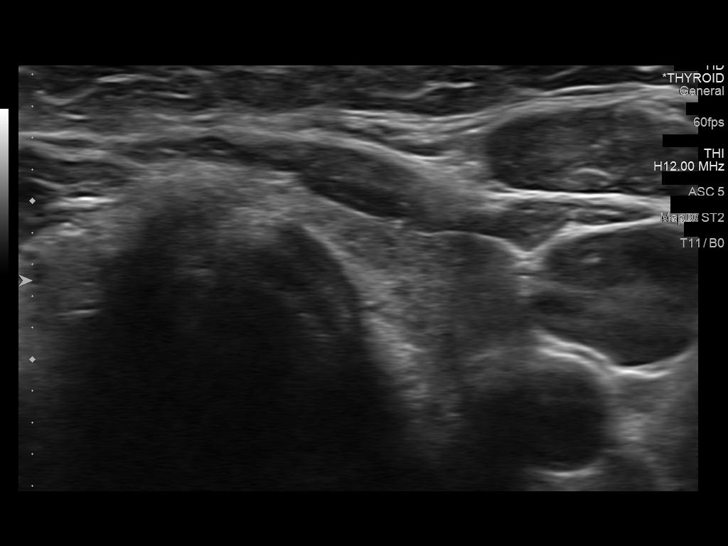
[im 19/36]
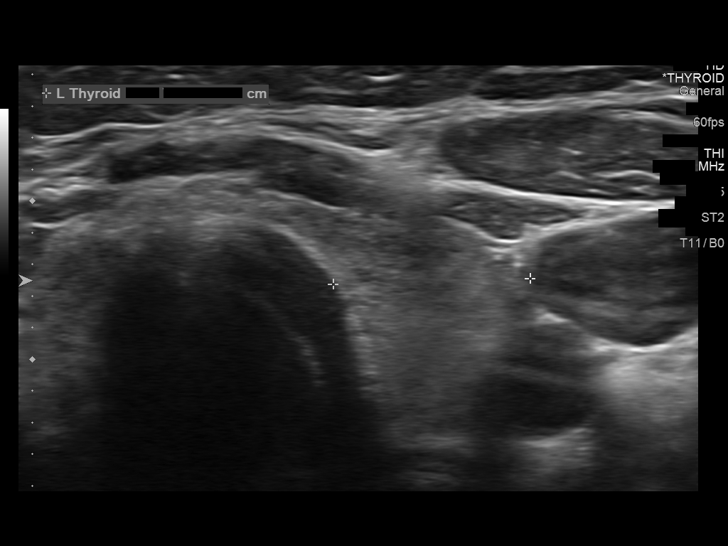
[im 22/36]
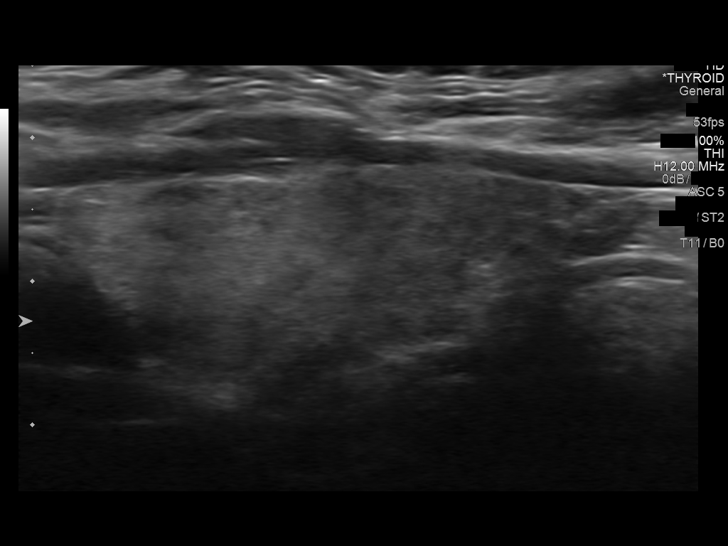
[im 24/36]
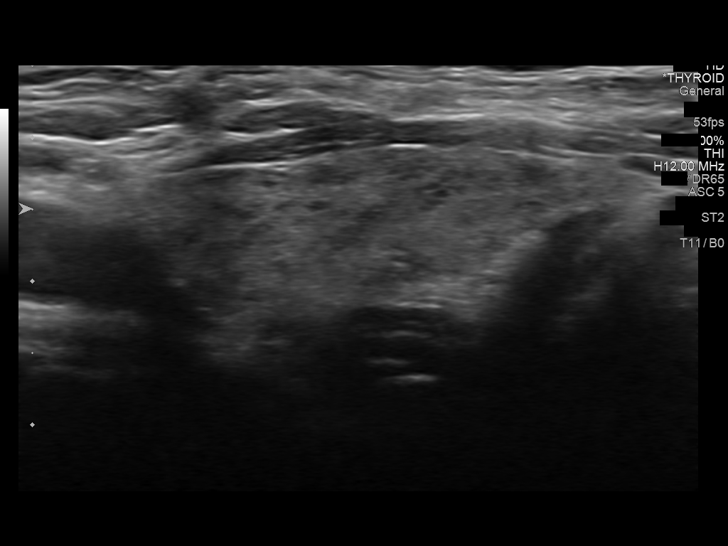
[im 27/36]
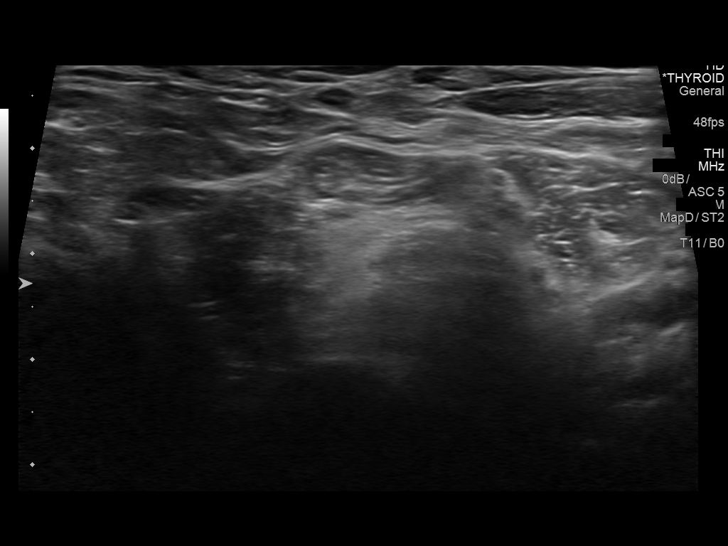
[im 30/36]
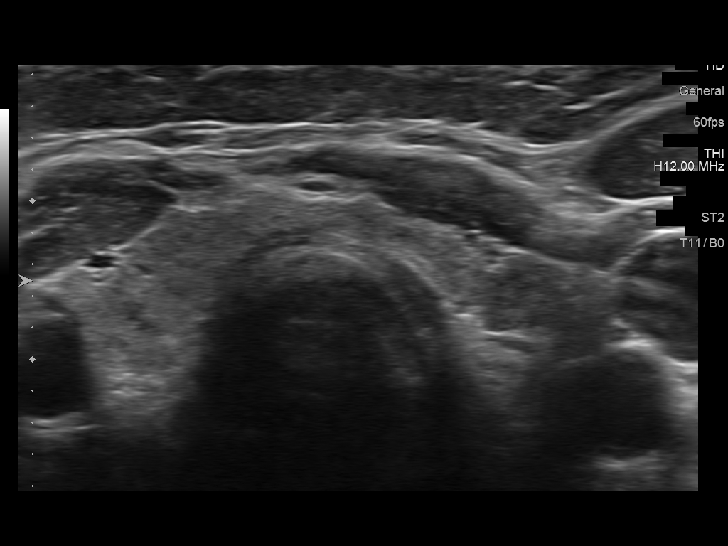
[im 33/36]
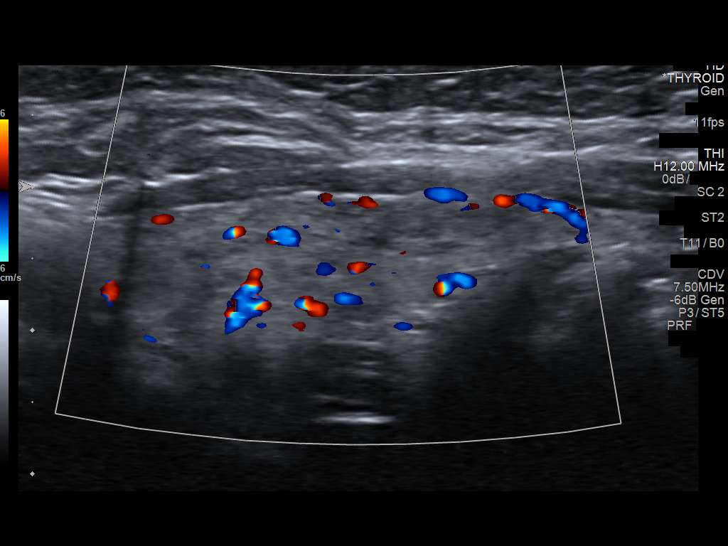
[im 36/36]
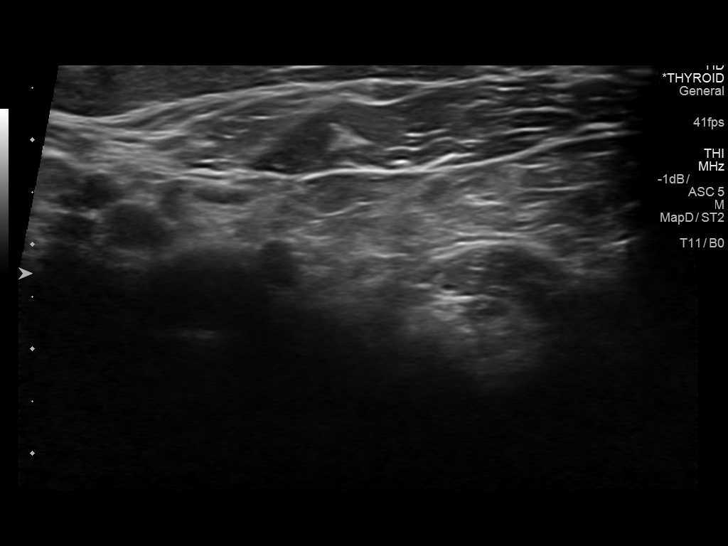

[14 of 25 positions shown; findings below may reference images not displayed]

FINDINGS: Parenchymal Echotexture: Moderately heterogenous

Isthmus: 0.2 cm

Right lobe: 4.1 x 1.6 x 2.0 cm

Left lobe: 4.2 x 1.6 x 1.2 cm

_________________________________________________________

Estimated total number of nodules >/= 1 cm: 0

Number of spongiform nodules >/=  2 cm not described below (TR1): 0

Number of mixed cystic and solid nodules >/= 1.5 cm not described
below (TR2): 0

_________________________________________________________

No discrete nodules are seen within the thyroid gland.
IMPRESSION: The gland is heterogeneous but there is no focal nodule. It is
within normal limits in size.

The above is in keeping with the ACR TI-RADS recommendations - [HOSPITAL] 1225;[DATE].

## 2017-09-15 ENCOUNTER — Encounter: Payer: Self-pay | Admitting: Physician Assistant

## 2017-09-26 ENCOUNTER — Encounter: Payer: Self-pay | Admitting: Physician Assistant

## 2017-09-26 ENCOUNTER — Ambulatory Visit (INDEPENDENT_AMBULATORY_CARE_PROVIDER_SITE_OTHER): Payer: BC Managed Care – PPO | Admitting: Sports Medicine

## 2017-09-26 ENCOUNTER — Encounter: Payer: Self-pay | Admitting: Sports Medicine

## 2017-09-26 VITALS — BP 114/76 | HR 88 | Resp 18 | Wt 185.0 lb

## 2017-09-26 DIAGNOSIS — S93402A Sprain of unspecified ligament of left ankle, initial encounter: Secondary | ICD-10-CM | POA: Diagnosis not present

## 2017-09-26 DIAGNOSIS — Z711 Person with feared health complaint in whom no diagnosis is made: Secondary | ICD-10-CM | POA: Insufficient documentation

## 2017-09-26 DIAGNOSIS — IMO0001 Reserved for inherently not codable concepts without codable children: Secondary | ICD-10-CM | POA: Insufficient documentation

## 2017-09-26 NOTE — Assessment & Plan Note (Signed)
Some complaints of hair loss but only occasionally finds a couple of strands in the sink which is normal, negative hair pull test, I was unable to dislodge any strands of hair with my fingers. No intervention or treatment needed. She is in surgical menopause.

## 2017-09-26 NOTE — Progress Notes (Signed)
Subjective:    I'm seeing this patient as a consultation for: Nelson Chimes, PA-C  CC: Left ankle injury  HPI: Yesterday this pleasant 52 year old female was running around, she inverted her left ankle, she had immediate pain, swelling, no bruising but was able to bear weight.  It has remained swollen, painful anterior to the lateral malleolus, moderate, persistent without radiation.  In addition she is worried about having some hair fall out, she noted a few strands of hair in the sink recently.  Does not note any hairs pulling out when she runs her hands through her hair.  I reviewed the past medical history, family history, social history, surgical history, and allergies today and no changes were needed.  Please see the problem list section below in epic for further details.  Past Medical History: Past Medical History:  Diagnosis Date  . Breast cancer ([place])   . Cancer ([place])   . Current mild episode of major depressive disorder without prior episode ([place]) 12/17/2016  . Elevated serum creatinine 08/22/2016  . Heart palpitations 07/18/2017   07/18/2017 48-hr holter normal, rare PVC/PAC  . Hyperlipidemia   . Obesity   . OSA (obstructive sleep apnea)   . Osteoporosis   . Otalgia of both ears 03/27/2017  . Thyroid disease    Past Surgical History: Past Surgical History:  Procedure Laterality Date  . ABDOMINAL HYSTERECTOMY     TAH - no cancer  . ABDOMINOPLASTY    . APPENDECTOMY    . BREAST RECONSTRUCTION    . CESAREAN SECTION     x 3  . MASTECTOMY Bilateral    Social History: Social History   Socioeconomic History  . Marital status: Single    Spouse name: Not on file  . Number of children: Not on file  . Years of education: Not on file  . Highest education level: Not on file  Occupational History  . Not on file  Social Needs  . Financial resource strain: Not on file  . Food insecurity:    Worry: Not on file    Inability: Not on file  . Transportation needs:   Medical: Not on file    Non-medical: Not on file  Tobacco Use  . Smoking status: Never Smoker  . Smokeless tobacco: Never Used  Substance and Sexual Activity  . Alcohol use: Yes    Alcohol/week: 1.2 oz    Types: 2 Glasses of wine per week  . Drug use: No  . Sexual activity: Not Currently    Birth control/protection: None  Lifestyle  . Physical activity:    Days per week: Not on file    Minutes per session: Not on file  . Stress: Not on file  Relationships  . Social connections:    Talks on phone: Not on file    Gets together: Not on file    Attends religious service: Not on file    Active member of club or organization: Not on file    Attends meetings of clubs or organizations: Not on file    Relationship status: Not on file  Other Topics Concern  . Not on file  Social History Narrative  . Not on file   Family History: Family History  Problem Relation Age of Onset  . Stroke Mother   . Hyperlipidemia Mother   . Hypertension Mother   . Hyperlipidemia Father   . Hypertension Father   . Hypertension Maternal Grandmother   . Heart disease Maternal Grandfather   .  Hypertension Maternal Grandfather   . Hypertension Paternal Grandmother   . Heart disease Paternal Grandfather   . Hypertension Paternal Grandfather    Allergies: Allergies  Allergen Reactions  . Pollen Extract Shortness Of Breath    [Other] asthma  . Bee Pollen Other (See Comments)    Allergic to bees  -Shock] anaphylaxis  . Latex Rash    [Rash] red rash (gloves, and tape products)  . Letrozole Other (See Comments)    Muscle spasms allergic to generic only  . Levothyroxine Nausea Only    Only occurs with generic, not name brand  . Phentermine-Topiramate Palpitations    palpitations palpitations  . Pork Allergy Nausea Only    GI upset  . Tape Rash    [Rash] [Other] red/swollen   Medications: See med rec.  Review of Systems: No headache, visual changes, nausea, vomiting, diarrhea, constipation,  dizziness, abdominal pain, skin rash, fevers, chills, night sweats, weight loss, swollen lymph nodes, body aches, joint swelling, muscle aches, chest pain, shortness of breath, mood changes, visual or auditory hallucinations.   Objective:   General: Well Developed, well nourished, and in no acute distress.  Neuro:  Extra-ocular muscles intact, able to move all 4 extremities, sensation grossly intact.  Deep tendon reflexes tested were normal. Psych: Alert and oriented, mood congruent with affect. ENT:  Ears and nose appear unremarkable.  Hearing grossly normal. Neck: Unremarkable overall appearance, trachea midline.  No visible thyroid enlargement. Eyes: Conjunctivae and lids appear unremarkable.  Pupils equal and round. Skin: Warm and dry, no rashes noted.  Negative hair pull test. Cardiovascular: Pulses palpable, no extremity edema. Left ankle: Minimally swollen, mild tenderness over the ATFL. Range of motion is full in all directions. Strength is 5/5 in all directions. Stable lateral and medial ligaments; squeeze test and kleiger test unremarkable; Talar dome nontender; No pain at base of 5th MT; No tenderness over cuboid; No tenderness over N spot or navicular prominence No tenderness on posterior aspects of lateral and medial malleolus No sign of peroneal tendon subluxations; Negative tarsal tunnel tinel's Able to walk 4 steps.  Impression and Recommendations:   This case required medical decision making of moderate complexity.  First degree ankle sprain, left, initial encounter First degree lateral ankle sprain, ASO, rehab exercises, return as needed. Negative [place] ankle rules, no x-ray needed.  Worried well Some complaints of hair loss but only occasionally finds a couple of strands in the sink which is normal, negative hair pull test, I was unable to dislodge any strands of hair with my fingers. No intervention or treatment needed. She is in surgical  menopause. ___________________________________________ Gwen Her. Dianah Field, M.D., ABFM., CAQSM. Primary Care and [place] Instructor of [facility] [employer] [facility] [employer]

## 2017-09-26 NOTE — Assessment & Plan Note (Signed)
First degree lateral ankle sprain, ASO, rehab exercises, return as needed. Negative [place] ankle rules, no x-ray needed.

## 2017-10-04 ENCOUNTER — Ambulatory Visit (INDEPENDENT_AMBULATORY_CARE_PROVIDER_SITE_OTHER): Payer: BC Managed Care – PPO | Admitting: Physician Assistant

## 2017-10-04 ENCOUNTER — Encounter: Payer: Self-pay | Admitting: Physician Assistant

## 2017-10-04 VITALS — BP 101/69 | HR 83 | Wt 185.0 lb

## 2017-10-04 DIAGNOSIS — R635 Abnormal weight gain: Secondary | ICD-10-CM

## 2017-10-04 DIAGNOSIS — Z713 Dietary counseling and surveillance: Secondary | ICD-10-CM | POA: Diagnosis not present

## 2017-10-04 DIAGNOSIS — Z9109 Other allergy status, other than to drugs and biological substances: Secondary | ICD-10-CM | POA: Insufficient documentation

## 2017-10-04 DIAGNOSIS — Z87898 Personal history of other specified conditions: Secondary | ICD-10-CM | POA: Diagnosis not present

## 2017-10-04 DIAGNOSIS — Z9289 Personal history of other medical treatment: Secondary | ICD-10-CM | POA: Insufficient documentation

## 2017-10-04 HISTORY — DX: Other allergy status, other than to drugs and biological substances: Z91.09

## 2017-10-04 MED ORDER — PHENTERMINE-TOPIRAMATE ER 7.5-46 MG PO CP24: 1.0000 | ORAL_CAPSULE | Freq: Every morning | ORAL | 0 refills | Status: DC

## 2017-10-04 NOTE — Patient Instructions (Addendum)
Goals: 1. Increase my physical activity - walk 2 days per week - take the stairs when I can, park further away from entrances when I can, get up from my desk and stand once per hour 2. Avoid processed sugar/desserts - choose fruit or yogurt with <10g of sugar as a healthy alternative  -1400 calorie, moderate protein diet - 30-35% calories from protein - 30% calories from fat - 35-40% from carbs: If diabetic, limit carbs to 30 g per meal and 15 g per snack - MEASURE your calories (MyFitnessPal, LoseIt app of some sort) - 8-11 glasses of water per day - limit caffeine to 1 unsweetened beverage per day - avoid fried foods, saturated fat, and heavily processed foods - keep sugar as low as possible  - follow DASH or Mediterranean diets for recipes - avoid skipping meals. Eat 3 regular meals per day with 1-2 snacks (stay within your calorie goal) OR graze every 4 hours. The important thing is to stay on a schedule - avoid eating within 2 hours of bedtime  Monitor your heart rate and blood pressure while taking Qsymia Stop the medication right away and contact me if you are having HR>100, fast or rapid pulses, irregular heart beats, or BP>140/90

## 2017-10-04 NOTE — Progress Notes (Signed)
HPI:                                                                Sherry Romero is a 52 y.o. female who presents to [place] [place] today for weight loss counseling   Patient presents today for weight loss counseling follow-up. 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 Current weight: 185 lb x 2 years  She reports current healthy habits are: - eating more vegetables - drinking only water - getting more sleep / practicing sleep hygiene  She reports current behaviors that do not contribute to her health goals are - binge eating - lack of physical activity  Previous weight loss attempts: Nutrion counseling October 2018, June 2018 Qsymia June 2017 for 2 weeks, stopped due to dry eye/dry mouth Contrave April-May 2018 fatigue, palpitations and did not have adequate appetite suppression Weight management April-May 2016 [facility] Needle phobia with Saxenda   Depression screen [facility] 2/9 06/21/2017 02/13/2017 12/17/2016 09/24/2016  Decreased Interest 0 0 1 0  Down, Depressed, Hopeless 0 0 2 0  PHQ - 2 Score 0 0 3 0  Altered sleeping - - 2 -  Tired, decreased energy - - 2 -  Change in appetite - - 0 -  Feeling bad or failure about yourself  - - 0 -  Trouble concentrating - - 0 -  Moving slowly or fidgety/restless - - 0 -  Suicidal thoughts - - 0 -  PHQ-9 Score - - 7 -    GAD 7 : Generalized Anxiety Score 12/17/2016  Nervous, Anxious, on Edge 2  Control/stop worrying 1  Worry too much - different things 3  Trouble relaxing 1  Restless 0  Easily annoyed or irritable 1  Afraid - awful might happen 2  Total GAD 7 Score 10      Past Medical History:  Diagnosis Date  . Breast cancer ([place])   . Cancer ([place])   . Current mild episode of major depressive disorder without prior episode ([place]) 12/17/2016  . Elevated serum creatinine 08/22/2016  . Heart palpitations 07/18/2017   07/18/2017  48-hr holter normal, rare PVC/PAC  . Hyperlipidemia   . Obesity   . OSA (obstructive sleep apnea)   . Osteoporosis   . Otalgia of both ears 03/27/2017  . Thyroid disease    Past Surgical History:  Procedure Laterality Date  . ABDOMINAL HYSTERECTOMY     TAH - no cancer  . ABDOMINOPLASTY    . APPENDECTOMY    . BREAST RECONSTRUCTION    . CESAREAN SECTION     x 3  . MASTECTOMY Bilateral    Social History   Tobacco Use  . Smoking status: Never Smoker  . Smokeless tobacco: Never Used  Substance Use Topics  . Alcohol use: Yes    Alcohol/week: 1.2 oz    Types: 2 Glasses of wine per week   family history includes Heart disease in her maternal grandfather and paternal grandfather; Hyperlipidemia in her father and mother; Hypertension in her father, maternal grandfather, maternal grandmother, mother, paternal grandfather, and paternal grandmother; Stroke in her mother.    ROS: negative except as noted in the HPI  Medications: Current Outpatient Medications  Medication Sig Dispense Refill  . atorvastatin (LIPITOR) 20 MG tablet TAKE 1 TABLET BY MOUTH EVERY DAY 90 tablet 0  . Biotin 800 MCG TABS Take 800 mcg by mouth.    . clobetasol cream (TEMOVATE) 0.05 % Apply topically twice daily to affected areas for 2 weeks 15 g 0  . cyclobenzaprine (FLEXERIL) 10 MG tablet 1 tab by mouth up to 3 times per day 30 tablet 11  . denosumab (PROLIA) 60 MG/ML SOLN injection Inject 60 mg into the skin every 6 (six) months. Administer in upper arm, thigh, or abdomen    . DM-Phenylephrine-Acetaminophen (THERAFLU SEVERE COLD) 20-10-500 MG PACK Take 1 tablet by mouth every 8 (eight) hours as needed. 56 each   . ipratropium (ATROVENT) 0.06 % nasal spray Place 2 sprays into both nostrils 4 (four) times daily as needed. 15 mL 0  . magnesium oxide (MAG-OX) 400 MG tablet Take 400 mg by mouth daily.    . NONFORMULARY OR COMPOUNDED ITEM ZAAZ WBV machine 1 each 0  . SYNTHROID 50 MCG tablet TAKE 1 TABLET BY MOUTH  EVERY DAY 90 tablet 1   No current facility-administered medications for this visit.    Allergies  Allergen Reactions  . Pollen Extract Shortness Of Breath    [Other] asthma  . Bee Pollen Other (See Comments)    Allergic to bees  -Shock] anaphylaxis  . Latex Rash    [Rash] red rash (gloves, and tape products)  . Letrozole Other (See Comments)    Muscle spasms allergic to generic only  . Levothyroxine Nausea Only    Only occurs with generic, not name brand  . Phentermine-Topiramate Palpitations    palpitations palpitations  . Pork Allergy Nausea Only    GI upset  . Tape Rash    [Rash] [Other] red/swollen       Objective:  BP 101/69   Pulse 83   Wt 185 lb (83.9 kg)   BMI 38.01 kg/m  Gen:  alert, not ill-appearing, no distress, appropriate for age, obese female HEENT: head normocephalic without obvious abnormality, conjunctiva and cornea clear, wearing glasses, trachea midline Pulm: Normal work of breathing, normal phonation, clear to auscultation bilaterally, no wheezes, rales or rhonchi CV: Normal rate, regular rhythm, s1 and s2 distinct, no murmurs, clicks or rubs ; radial pulses 2+ symmetric Neuro: alert and oriented x 3, no tremor MSK: extremities atraumatic, normal gait and station, no peripheral edema Skin: intact, no rashes on exposed skin, no jaundice, no cyanosis Psych: well-groomed, cooperative, good eye contact, euthymic mood, affect mood-congruent, speech is articulate, and thought processes clear and goal-directed    No results found for this or any previous visit (from the past 72 hour(s)). No results found.    Assessment and Plan: 52 y.o. female with   1. Encounter for weight loss counseling - patient with class 2 obesity with co-morbid OSA and HLD would benefit from medical weight management - see patient instructions for weight loss goals and detailed plan  2. Class 2 obesity due to excess calories without serious comorbidity in adult,  unspecified BMI - counseled on weight loss through decreased caloric intake and increased aerobic exercise - 1400 calorie moderate protein diet  3. Abnormal weight gain Wt Readings from Last 3 Encounters:  10/04/17 185 lb (83.9 kg)  09/26/17 185 lb (83.9 kg)  08/21/17 183 lb (83 kg)  - since she has done Qsymia and Phentermine in the past, I will skip the starter dose and start her at 7.5-46  mg  History of Palpitations - patient had a reassuring 48 hr Holter 3 months ago, which showed rare PVC's/PAC's and no arrhythmia. BP and HR are normal today. Patient counseled to discontinue Qsymia if she experiences tachycardia or irregular pulse. I had a long discussion with her about the risks of stimulants, including cardiac arrhythmia. She is confident that it was Contrave which caused palpitations and that Phentermine has only caused dry mouth.   Encounter for weight loss counseling  Class 2 obesity due to excess calories without serious comorbidity in adult, unspecified BMI - Plan: Phentermine-Topiramate 7.5-46 MG CP24  Abnormal weight gain - Plan: Phentermine-Topiramate 7.5-46 MG CP24  History of palpitations  History of normal Holter exam - rare PVC/PAC, no arrythmia 06/2017   Patient education and anticipatory guidance given Patient agrees with treatment plan Follow-up as needed if symptoms worsen or fail to improve  Darlyne Russian PA-C

## 2017-10-08 ENCOUNTER — Telehealth: Payer: Self-pay

## 2017-10-08 DIAGNOSIS — E6609 Other obesity due to excess calories: Secondary | ICD-10-CM

## 2017-10-08 NOTE — Telephone Encounter (Signed)
Qsymia will cost pt $130.  She is requesting something cheaper. -EH/RMA

## 2017-10-10 ENCOUNTER — Other Ambulatory Visit: Payer: Self-pay | Admitting: Physician Assistant

## 2017-10-10 ENCOUNTER — Encounter: Payer: Self-pay | Admitting: Physician Assistant

## 2017-10-10 DIAGNOSIS — E6609 Other obesity due to excess calories: Secondary | ICD-10-CM

## 2017-10-10 MED ORDER — PHENTERMINE HCL 15 MG PO CAPS: 15.0000 mg | ORAL_CAPSULE | ORAL | 0 refills | Status: DC

## 2017-10-10 MED ORDER — TOPIRAMATE 25 MG PO TABS: ORAL_TABLET | ORAL | 0 refills | Status: DC

## 2017-10-10 NOTE — Telephone Encounter (Signed)
She can download the manufacturer's coupon from [place] is a branded medication, so there is no generic equivalent The best we can do is Phentermine and Topamax, which will be similar to both components in the Qsymia She will need to self-titrate Topamax from 25 mg at bedtime for 1 week to 25 mg twice a day. Common side effects are brain fog, numbness/tingling in the extremities, altered taste. Will also route to patient as MyChart message

## 2017-10-14 NOTE — Telephone Encounter (Signed)
Pt viewed on mychart -EH/RMA

## 2017-10-15 ENCOUNTER — Encounter: Payer: Self-pay | Admitting: Physician Assistant

## 2017-10-24 ENCOUNTER — Encounter: Payer: Self-pay | Admitting: Physician Assistant

## 2017-11-05 ENCOUNTER — Encounter: Payer: Self-pay | Admitting: Physician Assistant

## 2017-11-05 ENCOUNTER — Ambulatory Visit (INDEPENDENT_AMBULATORY_CARE_PROVIDER_SITE_OTHER): Payer: BC Managed Care – PPO | Admitting: Physician Assistant

## 2017-11-05 VITALS — BP 105/70 | HR 90 | Wt 186.0 lb

## 2017-11-05 DIAGNOSIS — E6609 Other obesity due to excess calories: Secondary | ICD-10-CM

## 2017-11-05 DIAGNOSIS — E782 Mixed hyperlipidemia: Secondary | ICD-10-CM | POA: Diagnosis not present

## 2017-11-05 DIAGNOSIS — F418 Other specified anxiety disorders: Secondary | ICD-10-CM | POA: Diagnosis not present

## 2017-11-05 DIAGNOSIS — G4700 Insomnia, unspecified: Secondary | ICD-10-CM

## 2017-11-05 MED ORDER — TOPIRAMATE 50 MG PO TABS: 50.0000 mg | ORAL_TABLET | Freq: Two times a day (BID) | ORAL | 2 refills | Status: DC

## 2017-11-05 MED ORDER — RAMELTEON 8 MG PO TABS: 8.0000 mg | ORAL_TABLET | Freq: Every evening | ORAL | 2 refills | Status: DC | PRN

## 2017-11-05 MED ORDER — ATORVASTATIN CALCIUM 20 MG PO TABS: 20.0000 mg | ORAL_TABLET | Freq: Every day | ORAL | 1 refills | Status: DC

## 2017-11-05 NOTE — Progress Notes (Signed)
HPI:                                                                Sherry Romero is a 52 y.o. female who presents to [place]: [place] today for anxiety  Patient presents today c/o anxiety and excessive worry related to her son's graduation and her upcoming breast surgery. She is worried her son will not graduate because he is failing math. Reports feeling "hopeless" specifically about this situation. She is also nervous about having breast reconstruction modification at the end of the month.  Denies depressed mood or anhedonia. She has a sister who is taking Paxil and she is wondering if she would benefit from this medication. She has never taken any antidepressants before.   Continues to endorse difficulty falling asleep. Typical bedtime 10-11 and wakes up at 5 am. Takes up to an hour to fall asleep. Has tried Melatonin and Trazodone for sleep onset insomnia, but states it takes a very long time to start working and leaves her feeling groggy the next day.     Depression screen [facility] 2/9 11/05/2017 06/21/2017 02/13/2017 12/17/2016 09/24/2016  Decreased Interest 0 0 0 1 0  Down, Depressed, Hopeless 2 0 0 2 0  PHQ - 2 Score 2 0 0 3 0  Altered sleeping 3 - - 2 -  Tired, decreased energy 2 - - 2 -  Change in appetite 3 - - 0 -  Feeling bad or failure about yourself  3 - - 0 -  Trouble concentrating 0 - - 0 -  Moving slowly or fidgety/restless 0 - - 0 -  Suicidal thoughts 0 - - 0 -  PHQ-9 Score 13 - - 7 -  Difficult doing work/chores Not difficult at all - - - -    GAD 7 : Generalized Anxiety Score 11/05/2017 12/17/2016  Nervous, Anxious, on Edge 1 2  Control/stop worrying 2 1  Worry too much - different things 0 3  Trouble relaxing 1 1  Restless 0 0  Easily annoyed or irritable 0 1  Afraid - awful might happen 1 2  Total GAD 7 Score 5 10  Anxiety Difficulty Not difficult at all -      Past Medical History:  Diagnosis Date  . Breast  cancer ([place])   . Cancer ([place])   . Current mild episode of major depressive disorder without prior episode ([place]) 12/17/2016  . Elevated serum creatinine 08/22/2016  . Environmental allergies 10/04/2017  . Heart palpitations 07/18/2017   07/18/2017 48-hr holter normal, rare PVC/PAC  . Hyperlipidemia   . Obesity   . OSA (obstructive sleep apnea)   . Osteoporosis   . Otalgia of both ears 03/27/2017  . Thyroid disease    Past Surgical History:  Procedure Laterality Date  . ABDOMINAL HYSTERECTOMY     TAH - no cancer  . ABDOMINOPLASTY    . APPENDECTOMY    . BREAST RECONSTRUCTION    . CESAREAN SECTION     x 3  . MASTECTOMY Bilateral    Social History   Tobacco Use  . Smoking status: Never Smoker  . Smokeless tobacco: Never Used  Substance Use Topics  . Alcohol use: Yes    Alcohol/week: 1.2 oz  Types: 2 Glasses of wine per week   family history includes Heart disease in her maternal grandfather and paternal grandfather; Hyperlipidemia in her father and mother; Hypertension in her father, maternal grandfather, maternal grandmother, mother, paternal grandfather, and paternal grandmother; Stroke in her mother.    ROS: negative except as noted in the HPI  Medications: Current Outpatient Medications  Medication Sig Dispense Refill  . atorvastatin (LIPITOR) 20 MG tablet TAKE 1 TABLET BY MOUTH EVERY DAY 90 tablet 0  . Biotin 800 MCG TABS Take 800 mcg by mouth.    . clobetasol cream (TEMOVATE) 0.05 % Apply topically twice daily to affected areas for 2 weeks 15 g 0  . cyclobenzaprine (FLEXERIL) 10 MG tablet 1 tab by mouth up to 3 times per day 30 tablet 11  . denosumab (PROLIA) 60 MG/ML SOLN injection Inject 60 mg into the skin every 6 (six) months. Administer in upper arm, thigh, or abdomen    . DM-Phenylephrine-Acetaminophen (THERAFLU SEVERE COLD) 20-10-500 MG PACK Take 1 tablet by mouth every 8 (eight) hours as needed. 56 each   . ipratropium (ATROVENT) 0.06 % nasal spray Place 2  sprays into both nostrils 4 (four) times daily as needed. 15 mL 0  . magnesium oxide (MAG-OX) 400 MG tablet Take 400 mg by mouth daily.    . NONFORMULARY OR COMPOUNDED ITEM ZAAZ WBV machine 1 each 0  . phentermine 15 MG capsule Take 1 capsule (15 mg total) by mouth every morning. 30 capsule 0  . SYNTHROID 50 MCG tablet TAKE 1 TABLET BY MOUTH EVERY DAY 90 tablet 1  . topiramate (TOPAMAX) 25 MG tablet Take 1 tablet (25 mg total) by mouth at bedtime for 7 days, THEN 1 tablet (25 mg total) 2 (two) times daily for 7 days. 45 tablet 0   No current facility-administered medications for this visit.    Allergies  Allergen Reactions  . Bee Pollen Other (See Comments)    Allergic to bees  -Shock] anaphylaxis  . Latex Rash    [Rash] red rash (gloves, and tape products)  . Letrozole Other (See Comments)    Muscle spasms allergic to generic only  . Contrave [Naltrexone-Bupropion Hcl Er] Other (See Comments)    Palpitation  . Levothyroxine Nausea Only    Only occurs with generic, not name brand  . Pork Allergy Nausea Only    GI upset  . Tape Rash    [Rash] [Other] red/swollen       Objective:  BP 105/70   Pulse 90   Wt 186 lb (84.4 kg)   BMI 38.21 kg/m  Gen:  alert, not ill-appearing, no distress, appropriate for age, obese female HEENT: head normocephalic without obvious abnormality, conjunctiva and cornea clear, trachea midline Pulm: Normal work of breathing, normal phonation Neuro: alert and oriented x 3, no tremor MSK: extremities atraumatic, normal gait and station Skin: intact, no rashes on exposed skin, no jaundice, no cyanosis Psych: well-groomed, cooperative, good eye contact, euthymic mood, affect mood-congruent, speech is articulate, and thought processes clear and goal-directed    No results found for this or any previous visit (from the past 72 hour(s)). No results found.    Assessment and Plan: 52 y.o. female with   Situational anxiety  Insomnia, unspecified  type - Plan: ramelteon (ROZEREM) 8 MG tablet  Mixed hyperlipidemia - Plan: atorvastatin (LIPITOR) 20 MG tablet  Class 2 obesity due to excess calories without serious comorbidity in adult, unspecified BMI - Plan: topiramate (TOPAMAX) 50 MG tablet  Long discussion about different treatment options for anxiety as well as risks and benefits. Given that her anxiety is situational and is not interfering with daily activities, I think the risk of SSRI's outweighs any potential benefit. Recommended CBT, mindfulness/meditation, yoga, and alternative wellness therapies. She is concerned about cost, so provided with multiple options (online, sliding scale, etc.)  She was doing well with melatonin for a time. Will try Ramelteon for sleep onset insomnia. Encouraged sleep hygiene.   Patient education and anticipatory guidance given Patient agrees with treatment plan Follow-up as needed if symptoms worsen or fail to improve  Darlyne Russian PA-C

## 2017-11-05 NOTE — Patient Instructions (Addendum)
Counseling: - psychologytoday.com: search engine to locate local counselors - Family Services in your county offer counseling on a sliding scale (pay what you can afford) - [facility]: we can place a referral for you to see one of licensed counselors in [place], [place], or [place] - online counseling: [place] and Orth[facility] (not covered by insurance, but affordable self-pay rates)  Other resources: - https://www.[place].net/ - mindbodygreen.com - [employer]cupsoftea - [employer].fi   Sleep Hygiene . Limiting daytime naps to 30 minutes . Napping does not make up for inadequate nighttime sleep. However, a short nap of 20-30 minutes can help to improve mood, alertness and performance.  . Avoiding stimulants such as  caffeine and nicotine close to bedtime.  And when it comes to alcohol, moderation is key 4. While alcohol is well-known to help you fall asleep faster, too much close to bedtime can disrupt sleep in the second half of the night as the body begins to process the alcohol.    . Exercising to promote good quality sleep.  As little as 10 minutes of aerobic exercise, such as walking or cycling, can drastically improve nighttime sleep quality.  For the best night's sleep, most people should avoid strenuous workouts close to bedtime. However, the effect of intense nighttime exercise on sleep differs from person to person, so find out what works best for you.   . Steering clear of food that can be disruptive right before sleep.   Heavy or rich foods, fatty or fried meals, spicy dishes, citrus fruits, and carbonated drinks can trigger indigestion for some people. When this occurs close to bedtime, it can lead to painful heartburn that disrupts sleep. . Ensuring adequate exposure to natural light.  This is particularly important for individuals who may not venture outside frequently. Exposure to sunlight  during the day, as well as darkness at night, helps to maintain a healthy sleep-wake cycle . Marland Kitchen Establishing a regular relaxing bedtime routine.  A regular nightly routine helps the body recognize that it is bedtime. This could include taking warm shower or bath, reading a book, or light stretches. When possible, try to avoid emotionally upsetting conversations and activities before attempting to sleep. . Making sure that the sleep environment is pleasant.  Mattress and pillows should be comfortable. The bedroom should be cool - between 60 and 67 degrees - for optimal sleep. Bright light from lamps, cell phone and TV screens can make it difficult to fall asleep4, so turn those light off or adjust them when possible. Consider using blackout curtains, eye shades, ear plugs, "white noise" machines, humidifiers, fans and other devices that can make the bedroom more relaxing. . Meditation. YouTube [place] [address]. There are many smartphone apps as well

## 2017-11-11 ENCOUNTER — Encounter: Payer: Self-pay | Admitting: Physician Assistant

## 2017-11-11 ENCOUNTER — Telehealth: Payer: Self-pay | Admitting: Physician Assistant

## 2017-11-11 ENCOUNTER — Ambulatory Visit: Payer: BC Managed Care – PPO | Admitting: Physician Assistant

## 2017-11-11 DIAGNOSIS — F5101 Primary insomnia: Secondary | ICD-10-CM

## 2017-11-11 MED ORDER — ESZOPICLONE 1 MG PO TABS: 1.0000 mg | ORAL_TABLET | Freq: Every evening | ORAL | 0 refills | Status: DC | PRN

## 2017-11-11 NOTE — Telephone Encounter (Signed)
Pharmacy notified of medication change and Rozerem has been cancelled.   Left message for patient to call back about medication change.

## 2017-11-11 NOTE — Telephone Encounter (Signed)
I was placing the information online and the preferred product for this patient is Lunesta. Rozerem is not covered by this patients plan. Please advise.

## 2017-11-11 NOTE — Telephone Encounter (Signed)
I have sent a prescription for generic Lunesta 1 mg to be taken immediately at bedtime as needed for insomnia. This is to replace the original prescription for Rozerem. I noticed that she was recently written a prescription for Diazepam and Hydrocodone by her breast surgeon Make sure she is aware that she should not combine any of these medications as there is a risk of respiratory depression and death

## 2017-11-12 ENCOUNTER — Encounter: Payer: Self-pay | Admitting: Physician Assistant

## 2017-11-12 NOTE — Telephone Encounter (Signed)
Patient notified of PCP recommendations and did not have any further questions.

## 2017-11-20 ENCOUNTER — Encounter: Payer: Self-pay | Admitting: Physician Assistant

## 2017-11-28 IMAGING — DX DG CHEST 2V
2 series · 2 of 2 positions shown · non-contrast
Comparison: None.

CLINICAL DATA: 51 y/o  F;

EXAM:
CHEST  2 VIEW

[chest pa]
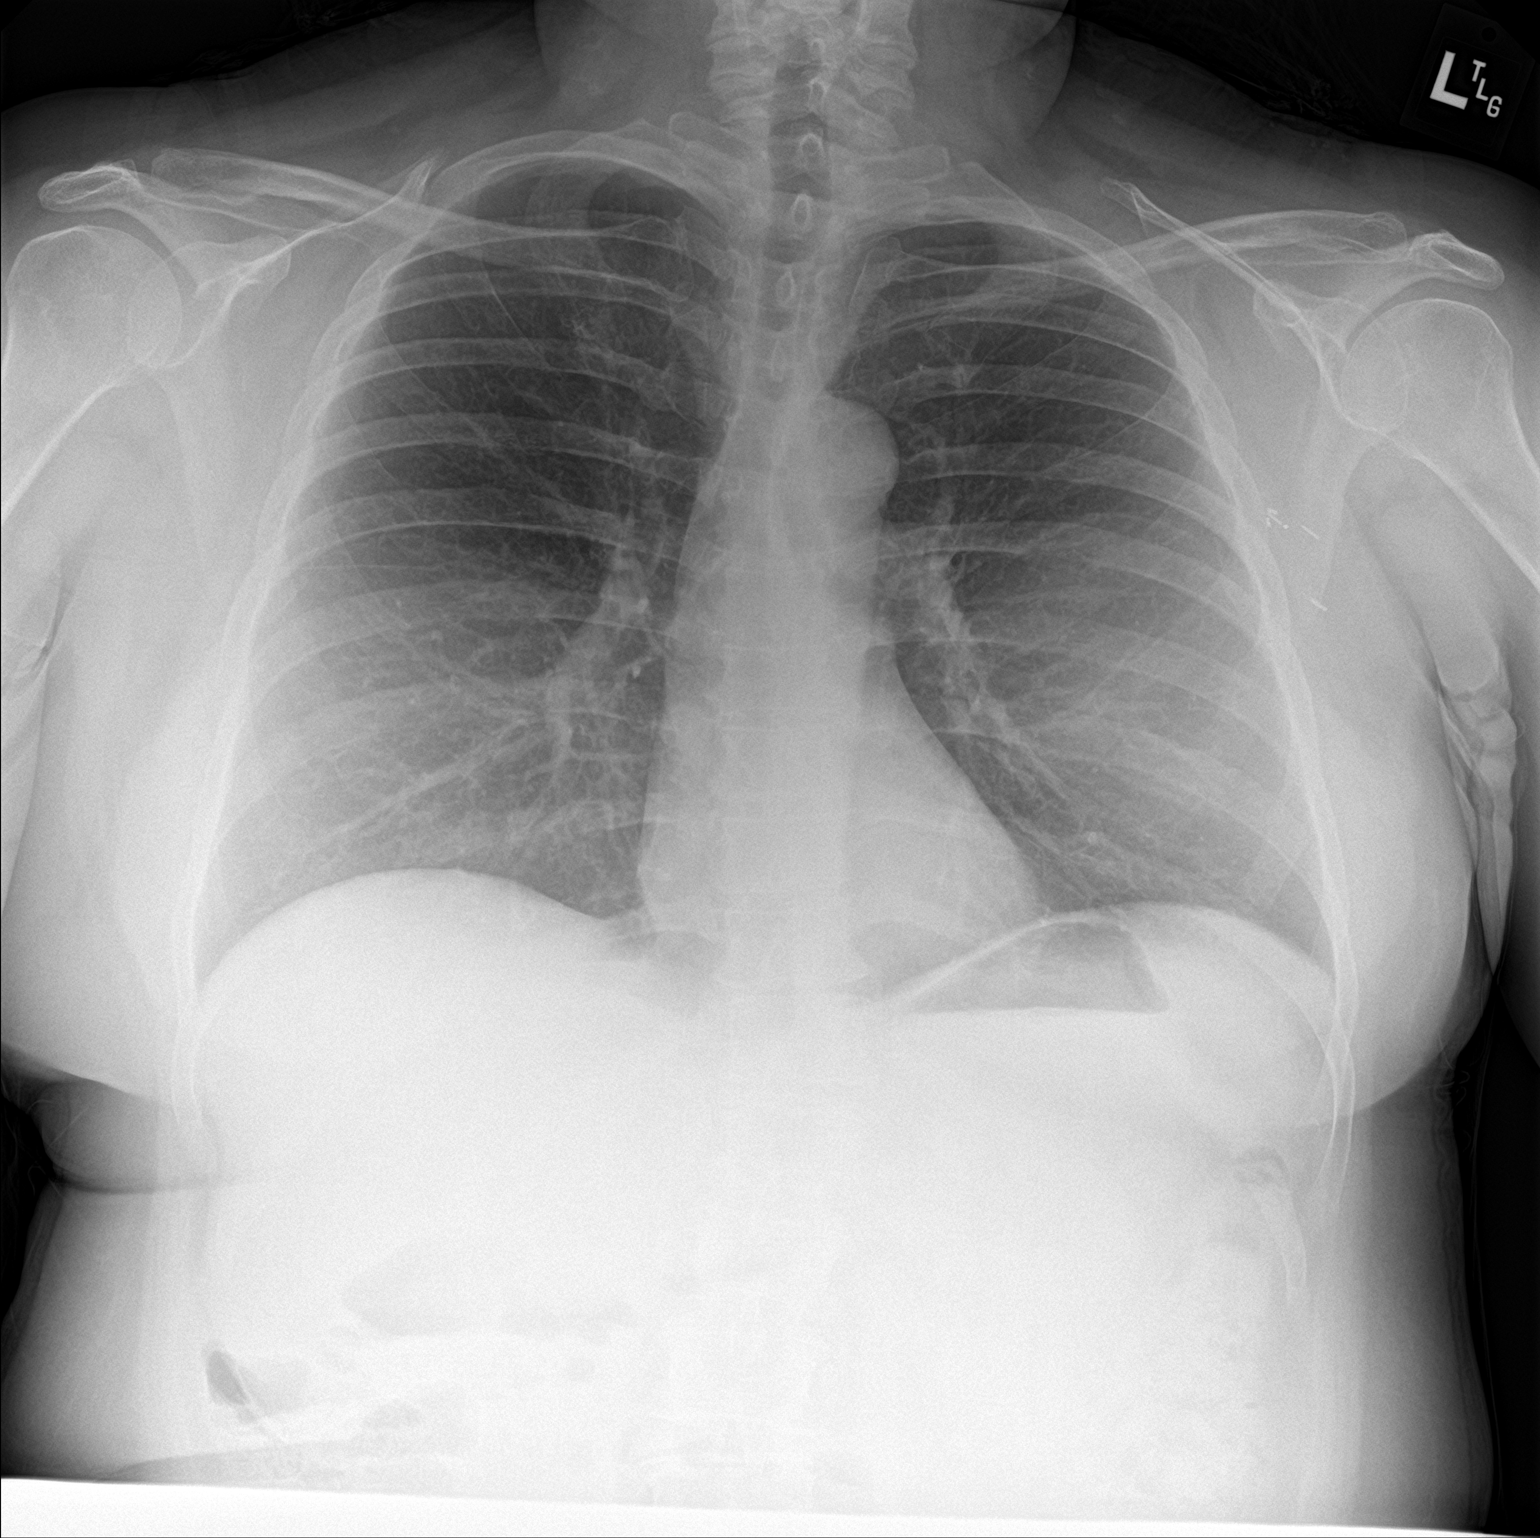

[chest lat]
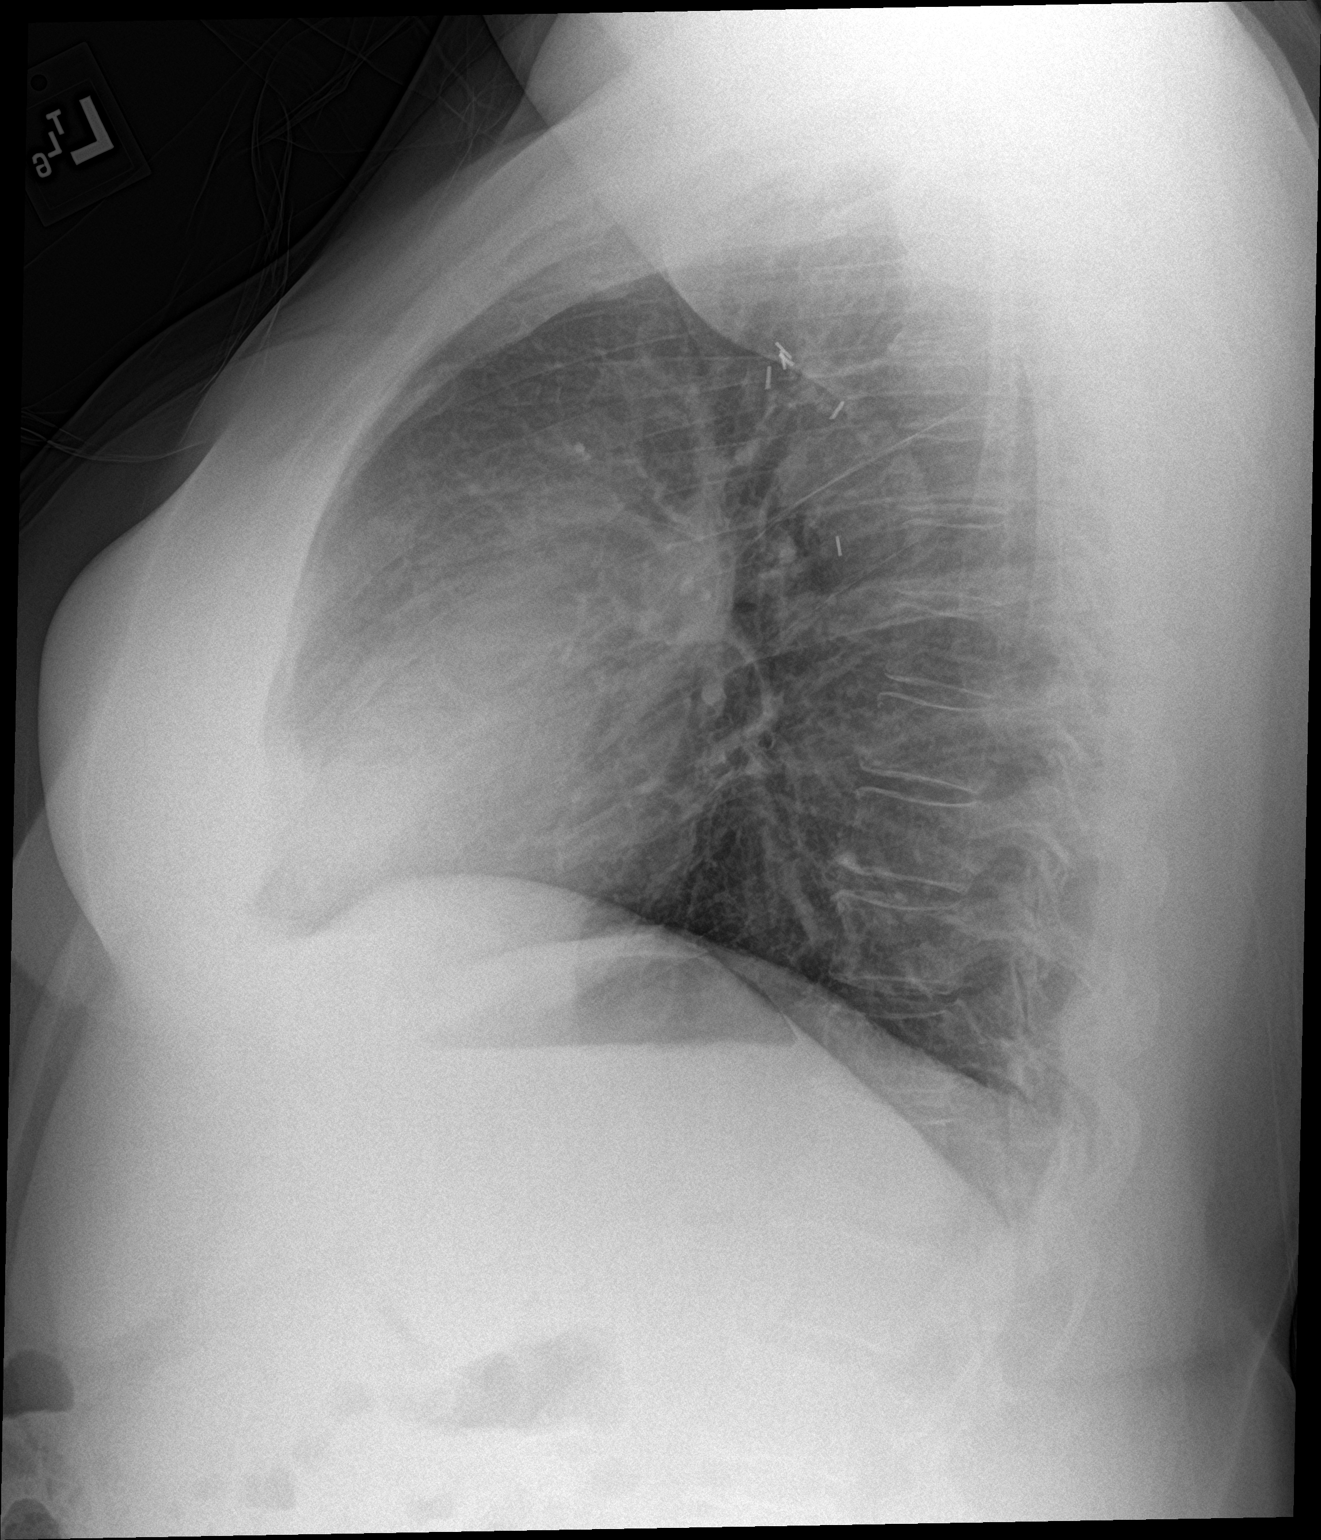

[2 of 2 positions shown; findings below may reference images not displayed]

FINDINGS: The heart size and mediastinal contours are within normal limits.
Both lungs are clear. No acute osseous abnormality. Left axillary
surgical clips. Mild S-shaped curvature of the spine and
degenerative changes.
IMPRESSION: No active cardiopulmonary disease.

By: Angi Billiot M.D.

## 2017-11-29 ENCOUNTER — Encounter: Payer: Self-pay | Admitting: Physician Assistant

## 2017-12-13 ENCOUNTER — Encounter: Payer: Self-pay | Admitting: Physician Assistant

## 2017-12-13 ENCOUNTER — Ambulatory Visit: Payer: BC Managed Care – PPO | Admitting: Physician Assistant

## 2017-12-13 VITALS — BP 104/69 | HR 73 | Wt 188.0 lb

## 2017-12-13 DIAGNOSIS — G9332 Myalgic encephalomyelitis/chronic fatigue syndrome: Secondary | ICD-10-CM

## 2017-12-13 DIAGNOSIS — E039 Hypothyroidism, unspecified: Secondary | ICD-10-CM

## 2017-12-13 DIAGNOSIS — R5382 Chronic fatigue, unspecified: Secondary | ICD-10-CM | POA: Diagnosis not present

## 2017-12-13 NOTE — Patient Instructions (Addendum)
Follow-up with [place]  [email or web address]  Request to see for thyroid disease and chronic fatigue

## 2017-12-13 NOTE — Progress Notes (Signed)
HPI:                                                                Sherry Romero is a 52 y.o. female who presents to [place]: [place] today for fatigue  This is a pleasant 52 yo postmenopausal female with PMH of OSA, breast cancer, osteoporosis, subclinical hypothyroidism who complains of worsening fatigue and low energy. Also reports that her blood pressure was low at her surgeon's office on 2-3 occasions. She is concerned that her thyroid is low.  Last TSH 1.22, 6 months ago. Currently on Synthroid 50 mcg. Denies fevers, chills, weight loss, joint pain, edema, palpitations, chest pain or rashes.  Past Medical History:  Diagnosis Date  . Breast cancer ([place])   . Cancer ([place])   . Current mild episode of major depressive disorder without prior episode ([place]) 12/17/2016  . Elevated serum creatinine 08/22/2016  . Environmental allergies 10/04/2017  . Heart palpitations 07/18/2017   07/18/2017 48-hr holter normal, rare PVC/PAC  . Hyperlipidemia   . Obesity   . OSA (obstructive sleep apnea)   . Osteoporosis   . Otalgia of both ears 03/27/2017  . Thyroid disease    Past Surgical History:  Procedure Laterality Date  . ABDOMINAL HYSTERECTOMY     TAH - no cancer  . ABDOMINOPLASTY    . APPENDECTOMY    . BREAST RECONSTRUCTION    . CESAREAN SECTION     x 3  . MASTECTOMY Bilateral    Social History   Tobacco Use  . Smoking status: Never Smoker  . Smokeless tobacco: Never Used  Substance Use Topics  . Alcohol use: Yes    Alcohol/week: 1.2 oz    Types: 2 Glasses of wine per week   family history includes Heart disease in her maternal grandfather and paternal grandfather; Hyperlipidemia in her father and mother; Hypertension in her father, maternal grandfather, maternal grandmother, mother, paternal grandfather, and paternal grandmother; Stroke in her mother.    ROS: negative except as noted in the HPI  Medications: Current Outpatient  Medications  Medication Sig Dispense Refill  . atorvastatin (LIPITOR) 20 MG tablet Take 1 tablet (20 mg total) by mouth at bedtime. 90 tablet 1  . Biotin 800 MCG TABS Take 800 mcg by mouth.    . Calcium Carb-Cholecalciferol (CALCIUM-VITAMIN D) 600-400 MG-UNIT TABS Take 1 tablet by mouth 2 (two) times daily after a meal.    . clobetasol cream (TEMOVATE) 0.05 % Apply topically twice daily to affected areas for 2 weeks 15 g 0  . cyclobenzaprine (FLEXERIL) 10 MG tablet 1 tab by mouth up to 3 times per day 30 tablet 11  . denosumab (PROLIA) 60 MG/ML SOLN injection Inject 60 mg into the skin every 6 (six) months. Administer in upper arm, thigh, or abdomen    . diazepam (VALIUM) 5 MG tablet TK 1 T PO Q 6-8 PRF MUSCLE SPASMS  0  . eszopiclone (LUNESTA) 1 MG TABS tablet Take 1 tablet (1 mg total) by mouth at bedtime as needed for sleep. Take immediately before bedtime 30 tablet 0  . HYDROcodone-acetaminophen (NORCO/VICODIN) 5-325 MG tablet   0  . magnesium oxide (MAG-OX) 400 MG tablet Take 400 mg by mouth daily.    Marland Kitchen  NONFORMULARY OR COMPOUNDED ITEM ZAAZ WBV machine 1 each 0  . SYNTHROID 50 MCG tablet TAKE 1 TABLET BY MOUTH EVERY DAY 90 tablet 1  . topiramate (TOPAMAX) 50 MG tablet Take 1 tablet (50 mg total) by mouth 2 (two) times daily. 60 tablet 2   No current facility-administered medications for this visit.    Allergies  Allergen Reactions  . Bee Pollen Other (See Comments)    Allergic to bees  -Shock] anaphylaxis  . Latex Rash    [Rash] red rash (gloves, and tape products)  . Letrozole Other (See Comments)    Muscle spasms allergic to generic only  . Contrave [Naltrexone-Bupropion Hcl Er] Other (See Comments)    Palpitation  . Levothyroxine Nausea Only    Only occurs with generic, not name brand  . Pork Allergy Nausea Only    GI upset  . Tape Rash    [Rash] [Other] red/swollen       Objective:  BP 104/69   Pulse 73   Wt 188 lb (85.3 kg)   BMI 38.62 kg/m  Gen:  alert, not  ill-appearing, no distress, appropriate for age, obese female HEENT: head normocephalic without obvious abnormality, conjunctiva and cornea clear, trachea midline Pulm: Normal work of breathing, normal phonation Neuro: alert and oriented x 3, no tremor MSK: extremities atraumatic, normal gait and station Skin: intact, no rashes on exposed skin, no jaundice, no cyanosis Psych: well-groomed, cooperative, good eye contact, euthymic mood, affect mood-congruent, speech is articulate, and thought processes clear and goal-directed    No results found for this or any previous visit (from the past 72 hour(s)). No results found.    Assessment and Plan: 52 y.o. female with   Acquired hypothyroidism - Plan: Thyroid Panel With TSH  Lab Results  Component Value Date   TSH 1.22 06/05/2017  - TSH at goal <2.5, 6 months ago. Normal thyroid US 09/2016. Re-checking thyroid studies today - She has a normal fatigue work- up (normal CBC, CMP, ESR, ferritin, vitamin D) - Referring to [employer] for further management of hypothyroidism and chronic fatigue   Patient education and anticipatory guidance given Patient agrees with treatment plan Follow-up as needed if symptoms worsen or fail to improve  Darlyne Russian PA-C

## 2017-12-14 LAB — THYROID PANEL WITH TSH
FREE THYROXINE INDEX: 3.4 (ref 1.4–3.8)
T3 Uptake: 30 % (ref 22–35)
T4, Total: 11.2 ug/dL (ref 5.1–11.9)
TSH: 1.13 mIU/L

## 2017-12-15 ENCOUNTER — Encounter: Payer: Self-pay | Admitting: Physician Assistant

## 2017-12-16 ENCOUNTER — Ambulatory Visit: Payer: BC Managed Care – PPO | Admitting: Physician Assistant

## 2017-12-19 ENCOUNTER — Encounter: Payer: Self-pay | Admitting: Physician Assistant

## 2018-01-24 ENCOUNTER — Encounter: Payer: Self-pay | Admitting: Physician Assistant

## 2018-01-26 ENCOUNTER — Encounter: Payer: Self-pay | Admitting: Physician Assistant

## 2018-01-29 ENCOUNTER — Encounter: Payer: Self-pay | Admitting: Physician Assistant

## 2018-01-29 ENCOUNTER — Ambulatory Visit (INDEPENDENT_AMBULATORY_CARE_PROVIDER_SITE_OTHER): Payer: BC Managed Care – PPO | Admitting: Physician Assistant

## 2018-01-29 VITALS — BP 106/69 | HR 77 | Wt 185.0 lb

## 2018-01-29 DIAGNOSIS — Z713 Dietary counseling and surveillance: Secondary | ICD-10-CM | POA: Diagnosis not present

## 2018-01-29 DIAGNOSIS — R635 Abnormal weight gain: Secondary | ICD-10-CM | POA: Diagnosis not present

## 2018-01-29 DIAGNOSIS — E6609 Other obesity due to excess calories: Secondary | ICD-10-CM | POA: Diagnosis not present

## 2018-01-29 DIAGNOSIS — R1011 Right upper quadrant pain: Secondary | ICD-10-CM

## 2018-01-29 NOTE — Progress Notes (Signed)
HPI:                                                                Sherry Romero is a 52 y.o. female who presents to [place]: [place] today for weight management  Reports she was having increased appetite and polyphagia related to stress at work.  She re-started Qsymia (left-over from a prior prescription) on 01/24/18, and has noticed improvement in these symptoms.She had self-discontinued this medication to xerostomia.  Current weight: 185 lb x 2 years  Previous weight loss attempts: Nutrion counseling October 2018, June 2018 Qsymia June 2017 for 2 weeks, stopped due to dry eye/dry mouth Contrave April-May 2018 fatigue, palpitations and did not have adequate appetite suppression Weight management April-May 2016 By Educational psychologist Program [facility] ph[facility] with [facility]  Also c/o constant burning sensation in her RUQ for the last 3 weeks. Denies fever, chills, nausea, vomiting, change in bowel habits.  Past Medical History:  Diagnosis Date  . Breast cancer ([place])   . Cancer ([place])   . Current mild episode of major depressive disorder without prior episode ([place]) 12/17/2016  . Elevated serum creatinine 08/22/2016  . Environmental allergies 10/04/2017  . Heart palpitations 07/18/2017   07/18/2017 48-hr holter normal, rare PVC/PAC  . Hyperlipidemia   . Obesity   . OSA (obstructive sleep apnea)   . Osteoporosis   . Otalgia of both ears 03/27/2017  . Thyroid disease    Past Surgical History:  Procedure Laterality Date  . ABDOMINAL HYSTERECTOMY     TAH - no cancer  . ABDOMINOPLASTY    . APPENDECTOMY    . BREAST RECONSTRUCTION    . CESAREAN SECTION     x 3  . MASTECTOMY Bilateral    Social History   Tobacco Use  . Smoking status: Never Smoker  . Smokeless tobacco: Never Used  Substance Use Topics  . Alcohol use: Yes    Alcohol/week: 2.0 standard drinks    Types: 2 Glasses of wine per week   family history includes Heart disease  in her maternal grandfather and paternal grandfather; Hyperlipidemia in her father and mother; Hypertension in her father, maternal grandfather, maternal grandmother, mother, paternal grandfather, and paternal grandmother; Stroke in her mother.    ROS: negative except as noted in the HPI  Medications: Current Outpatient Medications  Medication Sig Dispense Refill  . atorvastatin (LIPITOR) 20 MG tablet Take 1 tablet (20 mg total) by mouth at bedtime. 90 tablet 1  . Calcium Carb-Cholecalciferol (CALCIUM-VITAMIN D) 600-400 MG-UNIT TABS Take 1 tablet by mouth 2 (two) times daily after a meal.    . clobetasol cream (TEMOVATE) 0.05 % Apply topically twice daily to affected areas for 2 weeks 15 g 0  . cyclobenzaprine (FLEXERIL) 10 MG tablet 1 tab by mouth up to 3 times per day 30 tablet 11  . denosumab (PROLIA) 60 MG/ML SOLN injection Inject 60 mg into the skin every 6 (six) months. Administer in upper arm, thigh, or abdomen    . diazepam (VALIUM) 5 MG tablet TK 1 T PO Q 6-8 PRF MUSCLE SPASMS  0  . NONFORMULARY OR COMPOUNDED ITEM ZAAZ WBV machine 1 each 0  . Phentermine-Topiramate 7.5-46 MG CP24 Take 1 tablet by mouth every morning.  30 capsule 0  . SYNTHROID 50 MCG tablet TAKE 1 TABLET BY MOUTH EVERY DAY 90 tablet 1   No current facility-administered medications for this visit.    Allergies  Allergen Reactions  . Bee Pollen Other (See Comments)    Allergic to bees  -Shock] anaphylaxis  . Latex Rash    [Rash] red rash (gloves, and tape products)  . Letrozole Other (See Comments)    Muscle spasms allergic to generic only  . Contrave [Naltrexone-Bupropion Hcl Er] Other (See Comments)    Palpitation  . Levothyroxine Nausea Only    Only occurs with generic, not name brand  . Pork Allergy Nausea Only    GI upset  . Tape Rash    [Rash] [Other] red/swollen       Objective:  BP 106/69   Pulse 77   Wt 185 lb (83.9 kg)   BMI 38.01 kg/m  Gen:  alert, not ill-appearing, no distress,  appropriate for age, obese female HEENT: head normocephalic without obvious abnormality, conjunctiva and cornea clear, trachea midline Pulm: Normal work of breathing, normal phonation, clear to auscultation bilaterally, no wheezes, rales or rhonchi CV: Normal rate, regular rhythm, s1 and s2 distinct, no murmurs, clicks or rubs  GI: abdomen soft, non-tender, no rigidity or guarding, exam limited due to body habitus Neuro: alert and oriented x 3, no tremor MSK: extremities atraumatic, normal gait and station Skin: intact, no rashes on exposed skin, no jaundice, no cyanosis Psych: well-groomed, cooperative, good eye contact, euthymic mood, affect mood-congruent, speech is articulate, and thought processes clear and goal-directed    No results found for this or any previous visit (from the past 72 hour(s)). No results found.   Wt Readings from Last 3 Encounters:  01/29/18 185 lb (83.9 kg)  12/13/17 188 lb (85.3 kg)  11/05/17 186 lb (84.4 kg)    Assessment and Plan: 52 y.o. female with   .Diagnoses and all orders for this visit:  Encounter for weight loss counseling  RUQ abdominal pain -     US ABDOMEN LIMITED RUQ; Future  Abnormal weight gain -     Phentermine-Topiramate 7.5-46 MG CP24; Take 1 tablet by mouth every morning.  Class 2 obesity due to excess calories without serious comorbidity in adult, unspecified BMI -     Phentermine-Topiramate 7.5-46 MG CP24; Take 1 tablet by mouth every morning.    Counseled on weight loss through 1400 calorie-restricted, heart healthy diet and increased aerobic exercise Re-starting Qsymia 7.5-46mg   Patient instructions - 30-35% calories from protein - 30% calories from fat - 35-40% from carbs: If diabetic, limit carbs to 30 g per meal and 15 g per snack - MEASURE your calories (MyFitnessPal, LoseIt app of some sort) - 8-11 glasses of water per day - limit caffeine to 1 unsweetened beverage per day - avoid fried foods, saturated fat,  and heavily processed foods - keep sugar as low as possible  - follow DASH or Mediterranean diets for recipes - avoid skipping meals. Eat 3 regular meals per day with 1-2 snacks (stay within your calorie goal) OR graze every 4 hours. The important thing is to stay on a schedule - avoid eating within 2 hours of bedtime  Patient education and anticipatory guidance given Patient agrees with treatment plan Follow-up in 1 month or sooner as needed if symptoms worsen or fail to improve  Darlyne Russian PA-C

## 2018-01-29 NOTE — Patient Instructions (Addendum)
1400 calories/day - 30-35% calories from protein - 30% calories from fat - 35-40% from carbs: If diabetic, limit carbs to 30 g per meal and 15 g per snack - MEASURE your calories (MyFitnessPal, LoseIt app of some sort) - 8-11 glasses of water per day - limit caffeine to 1 unsweetened beverage per day - avoid fried foods, saturated fat, and heavily processed foods - keep sugar as low as possible  - follow DASH or Mediterranean diets for recipes - avoid skipping meals. Eat 3 regular meals per day with 1-2 snacks (stay within your calorie goal) OR graze every 4 hours. The important thing is to stay on a schedule - avoid eating within 2 hours of bedtime

## 2018-02-04 ENCOUNTER — Other Ambulatory Visit: Payer: Self-pay | Admitting: Physician Assistant

## 2018-02-04 DIAGNOSIS — E6609 Other obesity due to excess calories: Secondary | ICD-10-CM

## 2018-02-05 ENCOUNTER — Encounter: Payer: Self-pay | Admitting: Physician Assistant

## 2018-02-05 DIAGNOSIS — R1011 Right upper quadrant pain: Secondary | ICD-10-CM | POA: Insufficient documentation

## 2018-02-05 MED ORDER — PHENTERMINE-TOPIRAMATE ER 7.5-46 MG PO CP24: 1.0000 | ORAL_CAPSULE | Freq: Every morning | ORAL | 0 refills | Status: DC

## 2018-02-26 ENCOUNTER — Ambulatory Visit: Payer: BC Managed Care – PPO | Admitting: Physician Assistant

## 2018-03-20 ENCOUNTER — Other Ambulatory Visit: Payer: Self-pay | Admitting: Sports Medicine

## 2018-03-20 DIAGNOSIS — M5412 Radiculopathy, cervical region: Secondary | ICD-10-CM

## 2018-04-10 ENCOUNTER — Other Ambulatory Visit: Payer: Self-pay | Admitting: Sports Medicine

## 2018-04-10 DIAGNOSIS — M5412 Radiculopathy, cervical region: Secondary | ICD-10-CM

## 2018-05-16 ENCOUNTER — Other Ambulatory Visit: Payer: Self-pay | Admitting: Sports Medicine

## 2018-05-16 DIAGNOSIS — M5412 Radiculopathy, cervical region: Secondary | ICD-10-CM

## 2018-07-22 ENCOUNTER — Ambulatory Visit: Payer: BC Managed Care – PPO | Admitting: Sports Medicine

## 2018-07-24 ENCOUNTER — Ambulatory Visit (INDEPENDENT_AMBULATORY_CARE_PROVIDER_SITE_OTHER): Payer: BC Managed Care – PPO | Admitting: Sports Medicine

## 2018-07-24 ENCOUNTER — Encounter: Payer: Self-pay | Admitting: Sports Medicine

## 2018-07-24 DIAGNOSIS — M19041 Primary osteoarthritis, right hand: Secondary | ICD-10-CM

## 2018-07-24 DIAGNOSIS — M19042 Primary osteoarthritis, left hand: Secondary | ICD-10-CM

## 2018-07-24 DIAGNOSIS — M5412 Radiculopathy, cervical region: Secondary | ICD-10-CM | POA: Diagnosis not present

## 2018-07-24 MED ORDER — CYCLOBENZAPRINE HCL 10 MG PO TABS: 10.0000 mg | ORAL_TABLET | Freq: Every day | ORAL | 3 refills | Status: AC | PRN

## 2018-07-24 MED ORDER — MELOXICAM 15 MG PO TABS
ORAL_TABLET | ORAL | 3 refills | Status: DC
Start: 2018-07-24 — End: 2020-01-22

## 2018-07-24 NOTE — Assessment & Plan Note (Signed)
With Heberden and Bouchard nodes. She has already gotten a surgical opinion. We are to try nonsurgical measures, I did explain the limitations. Meloxicam, hand physical therapy in [place]. Return to see me in 1 month, if insufficient relief we will proceed with interphalangeal joint injections. Main pain is at the left second DIP and the first IP joint.

## 2018-07-24 NOTE — Progress Notes (Signed)
Subjective:    CC: Hand pain  HPI: Sherry Romero returns, she is a pleasant 53 year old female, she has a chronic history of left hand pain.  Starting in the right hand as well.  Localized mostly at the first IP joint and the second DIP.  She did see hand surgeon who recommended arthrodesis.  She is looking for a nonsurgical option.  She is only been using over-the-counter ibuprofen, pain is moderate, persistent, localized without radiation.  I reviewed the past medical history, family history, social history, surgical history, and allergies today and no changes were needed.  Please see the problem list section below in epic for further details.  Past Medical History: Past Medical History:  Diagnosis Date  . Breast cancer ([place])   . Cancer ([place])   . Current mild episode of major depressive disorder without prior episode ([place]) 12/17/2016  . Elevated serum creatinine 08/22/2016  . Environmental allergies 10/04/2017  . Heart palpitations 07/18/2017   07/18/2017 48-hr holter normal, rare PVC/PAC  . Hyperlipidemia   . Obesity   . OSA (obstructive sleep apnea)   . Osteoporosis   . Otalgia of both ears 03/27/2017  . Thyroid disease    Past Surgical History: Past Surgical History:  Procedure Laterality Date  . ABDOMINAL HYSTERECTOMY     TAH - no cancer  . ABDOMINOPLASTY    . APPENDECTOMY    . BREAST RECONSTRUCTION    . CESAREAN SECTION     x 3  . MASTECTOMY Bilateral    Social History: Social History   Socioeconomic History  . Marital status: Single    Spouse name: Not on file  . Number of children: Not on file  . Years of education: Not on file  . Highest education level: Not on file  Occupational History  . Not on file  Social Needs  . Financial resource strain: Not on file  . Food insecurity:    Worry: Not on file    Inability: Not on file  . Transportation needs:    Medical: Not on file    Non-medical: Not on file  Tobacco Use  . Smoking status: Never Smoker  . Smokeless  tobacco: Never Used  Substance and Sexual Activity  . Alcohol use: Yes    Alcohol/week: 2.0 standard drinks    Types: 2 Glasses of wine per week  . Drug use: No  . Sexual activity: Not Currently    Birth control/protection: None  Lifestyle  . Physical activity:    Days per week: Not on file    Minutes per session: Not on file  . Stress: Not on file  Relationships  . Social connections:    Talks on phone: Not on file    Gets together: Not on file    Attends religious service: Not on file    Active member of club or organization: Not on file    Attends meetings of clubs or organizations: Not on file    Relationship status: Not on file  Other Topics Concern  . Not on file  Social History Narrative  . Not on file   Family History: Family History  Problem Relation Age of Onset  . Stroke Mother   . Hyperlipidemia Mother   . Hypertension Mother   . Hyperlipidemia Father   . Hypertension Father   . Hypertension Maternal Grandmother   . Heart disease Maternal Grandfather   . Hypertension Maternal Grandfather   . Hypertension Paternal Grandmother   . Heart disease Paternal Grandfather   .  Hypertension Paternal Grandfather    Allergies: Allergies  Allergen Reactions  . Bee Pollen Other (See Comments)    Allergic to bees  -Shock] anaphylaxis  . Latex Rash    [Rash] red rash (gloves, and tape products)  . Letrozole Other (See Comments)    Muscle spasms allergic to generic only  . Contrave [Naltrexone-Bupropion Hcl Er] Other (See Comments)    Palpitation  . Levothyroxine Nausea Only    Only occurs with generic, not name brand  . Pork Allergy Nausea Only    GI upset  . Tape Rash    [Rash] [Other] red/swollen   Medications: See med rec.  Review of Systems: No fevers, chills, night sweats, weight loss, chest pain, or shortness of breath.   Objective:    General: Well Developed, well nourished, and in no acute distress.  Neuro: Alert and oriented x3, extra-ocular  muscles intact, sensation grossly intact.  HEENT: Normocephalic, atraumatic, pupils equal round reactive to light, neck supple, no masses, no lymphadenopathy, thyroid nonpalpable.  Skin: Warm and dry, no rashes. Cardiac: Regular rate and rhythm, no murmurs rubs or gallops, no lower extremity edema.  Respiratory: Clear to auscultation bilaterally. Not using accessory muscles, speaking in full sentences. Left hand: Swollen, tender Bouchard and Heberden nodes at the first and second digits.  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 She has already gotten a surgical opinion. We are to try nonsurgical measures, I did explain the limitations. Meloxicam, hand physical therapy in [place]. Return to see me in 1 month, if insufficient relief we will proceed with interphalangeal joint injections. Main pain is at the left second DIP and the first IP joint.  I spent 40 minutes with this patient, greater than 50% was face-to-face time counseling regarding the above diagnoses, specifically answering multiple questions, and providing her with anticipatory guidance. ___________________________________________ Gwen Her. Dianah Field, M.D., ABFM., CAQSM. Primary Care and Sports Medicine [facility]  Adjunct Professor of [place] of [facility] [employer]

## 2018-08-09 ENCOUNTER — Other Ambulatory Visit: Payer: Self-pay | Admitting: Physician Assistant

## 2018-08-21 ENCOUNTER — Ambulatory Visit: Payer: BC Managed Care – PPO | Admitting: Sports Medicine

## 2018-08-25 ENCOUNTER — Other Ambulatory Visit: Payer: Self-pay | Admitting: Physician Assistant

## 2018-08-28 ENCOUNTER — Other Ambulatory Visit: Payer: Self-pay | Admitting: Physician Assistant

## 2019-08-28 ENCOUNTER — Other Ambulatory Visit: Payer: Self-pay | Admitting: Sports Medicine

## 2019-08-28 DIAGNOSIS — M5412 Radiculopathy, cervical region: Secondary | ICD-10-CM

## 2019-12-15 ENCOUNTER — Ambulatory Visit: Payer: BC Managed Care – PPO | Admitting: Sports Medicine

## 2020-01-22 ENCOUNTER — Other Ambulatory Visit: Payer: Self-pay

## 2020-01-22 ENCOUNTER — Ambulatory Visit (INDEPENDENT_AMBULATORY_CARE_PROVIDER_SITE_OTHER): Payer: Self-pay | Admitting: Sports Medicine

## 2020-01-22 DIAGNOSIS — M67472 Ganglion, left ankle and foot: Secondary | ICD-10-CM

## 2020-01-22 NOTE — Assessment & Plan Note (Signed)
3 years ago I performed an aspiration and injection, this was successful, the ganglion cyst recurred 3 years later, repeat aspiration and injection today, she will continue to wear supportive shoes, return to see me as needed.

## 2020-01-22 NOTE — Progress Notes (Signed)
    Procedures performed today:    Procedure: Real-time Ultrasound Guided  aspiration/injection of left dorsal midfoot ganglion cyst Device: Samsung HS60  Verbal informed consent obtained.  Time-out conducted.  Noted no overlying erythema, induration, or other signs of local infection.  Skin prepped in a sterile fashion.  Local anesthesia: Topical Ethyl chloride.  With sterile technique and under real time ultrasound guidance:  Ganglion cyst aspirated, 1.5 cc of thick,  yellowish fluid, syringe switched and 1 cc kenalog 40 injected easily.   Completed without difficulty  Pain immediately resolved suggesting accurate placement of the medication.  Advised to call if fevers/chills, erythema, induration, drainage, or persistent bleeding.  Images permanently stored and available for review in the ultrasound unit.  Impression: Technically successful ultrasound guided injection.  Independent interpretation of notes and tests performed by another provider:   None.  Brief History, Exam, Impression, and Recommendations:    Ganglion cyst of left foot 3 years ago I performed an aspiration and injection, this was successful, the ganglion cyst recurred 3 years later, repeat aspiration and injection today, she will continue to wear supportive shoes, return to see me as needed.    ___________________________________________ Gwen Her. Dianah Field, M.D., ABFM., CAQSM. Primary Care and [place] Instructor of [place] of [facility] [employer]

## 2020-05-18 ENCOUNTER — Ambulatory Visit (INDEPENDENT_AMBULATORY_CARE_PROVIDER_SITE_OTHER): Payer: Managed Care, Other (non HMO) | Admitting: Sports Medicine

## 2020-05-18 DIAGNOSIS — R2241 Localized swelling, mass and lump, right lower limb: Secondary | ICD-10-CM

## 2020-05-18 MED ORDER — MELOXICAM 15 MG PO TABS: ORAL_TABLET | ORAL | 3 refills | Status: AC

## 2020-05-18 NOTE — Assessment & Plan Note (Signed)
Back in the summertime this pleasant 54 year old female was hiking, and she impacted her right anterior lower leg on an object. She developed some swelling as well as a knot in the subcutaneous tissues. On exam she has a small subcentimeter nodule, well-defined and movable that looks to be in close proximity to one of her superficial veins. I think the differential here includes fat necrosis and a superficial venous thrombophlebitis. We will go ahead and add meloxicam, and get an ultrasound of the structure. Negative Bevelyn Buckles' sign so no suspicion for DVT, good strength and motion as well as sensation i.e., neurovascularly intact distally. Return to see me on an as-needed basis for this.

## 2020-05-18 NOTE — Progress Notes (Signed)
    Procedures performed today:    None.  Independent interpretation of notes and tests performed by another provider:   None.  Brief History, Exam, Impression, and Recommendations:    Mass of lower leg, right Back in the summertime this pleasant 54 year old female was hiking, and she impacted her right anterior lower leg on an object. She developed some swelling as well as a knot in the subcutaneous tissues. On exam she has a small subcentimeter nodule, well-defined and movable that looks to be in close proximity to one of her superficial veins. I think the differential here includes fat necrosis and a superficial venous thrombophlebitis. We will go ahead and add meloxicam, and get an ultrasound of the structure. Negative Bevelyn Buckles' sign so no suspicion for DVT, good strength and motion as well as sensation i.e., neurovascularly intact distally. Return to see me on an as-needed basis for this.     ___________________________________________ Gwen Her. Dianah Field, M.D., ABFM., CAQSM. Primary Care and [place] Instructor of [place] of [facility]

## 2020-05-21 ENCOUNTER — Other Ambulatory Visit: Payer: Self-pay

## 2020-05-21 ENCOUNTER — Ambulatory Visit (HOSPITAL_BASED_OUTPATIENT_CLINIC_OR_DEPARTMENT_OTHER)
Admission: RE | Admit: 2020-05-21 | Discharge: 2020-05-21 | Disposition: A | Discharge status: Home / Self Care | Payer: BC Managed Care – PPO | Source: Ambulatory Visit | Attending: Sports Medicine | Admitting: Sports Medicine

## 2020-05-21 DIAGNOSIS — R2241 Localized swelling, mass and lump, right lower limb: Secondary | ICD-10-CM | POA: Diagnosis present

## 2020-07-01 ENCOUNTER — Other Ambulatory Visit: Payer: Self-pay

## 2020-07-01 ENCOUNTER — Ambulatory Visit (INDEPENDENT_AMBULATORY_CARE_PROVIDER_SITE_OTHER): Payer: Managed Care, Other (non HMO)

## 2020-07-01 ENCOUNTER — Ambulatory Visit (INDEPENDENT_AMBULATORY_CARE_PROVIDER_SITE_OTHER): Payer: Managed Care, Other (non HMO) | Admitting: Sports Medicine

## 2020-07-01 DIAGNOSIS — Z6837 Body mass index (BMI) 37.0-37.9, adult: Secondary | ICD-10-CM

## 2020-07-01 DIAGNOSIS — M1711 Unilateral primary osteoarthritis, right knee: Secondary | ICD-10-CM

## 2020-07-01 DIAGNOSIS — M545 Low back pain, unspecified: Secondary | ICD-10-CM

## 2020-07-01 DIAGNOSIS — M1611 Unilateral primary osteoarthritis, right hip: Secondary | ICD-10-CM | POA: Diagnosis not present

## 2020-07-01 DIAGNOSIS — E6609 Other obesity due to excess calories: Secondary | ICD-10-CM | POA: Diagnosis not present

## 2020-07-01 MED ORDER — ACETAMINOPHEN ER 650 MG PO TBCR: 650.0000 mg | EXTENDED_RELEASE_TABLET | Freq: Three times a day (TID) | ORAL | 3 refills | Status: AC | PRN

## 2020-07-01 NOTE — Progress Notes (Signed)
    Procedures performed today:    None.  Independent interpretation of notes and tests performed by another provider:   None.  Brief History, Exam, Impression, and Recommendations:    Primary osteoarthritis of right knee Right knee pain medial joint line, she got nephritis with ibuprofen so avoiding NSAIDs for now, we will do arthritis strength Tylenol, x-rays, home rehab. I did fill out some forms for her to park closer at work.  Primary osteoarthritis of right hip Right groin pain, worse with internal rotation, she got nephritis with ibuprofen so avoiding NSAIDs for now, we will do arthritis strength Tylenol, x-rays, home rehab. I did fill out some forms for her to park closer at work.  Class 2 obesity due to excess calories without serious comorbidity in adult She does have an appointment with bariatric surgery.  I really think she should proceed as this could save her knees and her hip.    ___________________________________________ Gwen Her. Dianah Field, M.D., ABFM., CAQSM. Primary Care and [place] Instructor of [place] of [facility] [employer]

## 2020-07-01 NOTE — Assessment & Plan Note (Signed)
She does have an appointment with bariatric surgery.  I really think she should proceed as this could save her knees and her hip.

## 2020-07-01 NOTE — Assessment & Plan Note (Signed)
Right groin pain, worse with internal rotation, she got nephritis with ibuprofen so avoiding NSAIDs for now, we will do arthritis strength Tylenol, x-rays, home rehab. I did fill out some forms for her to park closer at work.

## 2020-07-01 NOTE — Assessment & Plan Note (Signed)
Right knee pain medial joint line, she got nephritis with ibuprofen so avoiding NSAIDs for now, we will do arthritis strength Tylenol, x-rays, home rehab. I did fill out some forms for her to park closer at work.

## 2020-08-02 ENCOUNTER — Ambulatory Visit: Payer: Managed Care, Other (non HMO) | Admitting: Sports Medicine

## 2020-08-11 ENCOUNTER — Encounter: Payer: Self-pay | Admitting: Sports Medicine

## 2020-08-11 ENCOUNTER — Ambulatory Visit: Payer: BC Managed Care – PPO | Admitting: Sports Medicine
